# Patient Record
Sex: Male | Born: 1975 | Race: White | Hispanic: No | Marital: Married | Smoking: Current every day smoker
Health system: Southern US, Community
[De-identification: ages and names within clinical notes are randomized; demographics above are authoritative.]

## PROBLEM LIST (undated history)

## (undated) DIAGNOSIS — F1411 Cocaine abuse, in remission: Secondary | ICD-10-CM

## (undated) DIAGNOSIS — Z8249 Family history of ischemic heart disease and other diseases of the circulatory system: Secondary | ICD-10-CM

## (undated) DIAGNOSIS — K859 Acute pancreatitis without necrosis or infection, unspecified: Secondary | ICD-10-CM

## (undated) DIAGNOSIS — F1210 Cannabis abuse, uncomplicated: Secondary | ICD-10-CM

## (undated) DIAGNOSIS — N200 Calculus of kidney: Secondary | ICD-10-CM

## (undated) DIAGNOSIS — F1010 Alcohol abuse, uncomplicated: Secondary | ICD-10-CM

## (undated) DIAGNOSIS — Z720 Tobacco use: Secondary | ICD-10-CM

## (undated) DIAGNOSIS — J45909 Unspecified asthma, uncomplicated: Secondary | ICD-10-CM

## (undated) HISTORY — PX: APPENDECTOMY: SHX54

---

## 2004-10-19 ENCOUNTER — Emergency Department: Payer: Self-pay | Admitting: Emergency Medicine

## 2005-09-20 ENCOUNTER — Emergency Department: Payer: Self-pay | Admitting: Emergency Medicine

## 2008-05-27 ENCOUNTER — Emergency Department: Payer: Self-pay | Admitting: Emergency Medicine

## 2008-05-27 ENCOUNTER — Other Ambulatory Visit: Payer: Self-pay

## 2008-06-12 ENCOUNTER — Emergency Department: Payer: Self-pay

## 2008-07-11 ENCOUNTER — Emergency Department: Payer: Self-pay | Admitting: Emergency Medicine

## 2009-11-12 ENCOUNTER — Emergency Department: Payer: Self-pay | Admitting: Emergency Medicine

## 2009-12-10 ENCOUNTER — Ambulatory Visit: Payer: Self-pay | Admitting: Urology

## 2010-04-27 ENCOUNTER — Emergency Department: Payer: Self-pay | Admitting: Emergency Medicine

## 2011-04-23 ENCOUNTER — Emergency Department: Payer: Self-pay | Admitting: Emergency Medicine

## 2011-05-12 ENCOUNTER — Emergency Department: Payer: Self-pay | Admitting: Emergency Medicine

## 2011-05-29 ENCOUNTER — Emergency Department: Payer: Self-pay | Admitting: Unknown Physician Specialty

## 2011-10-01 ENCOUNTER — Emergency Department: Payer: Self-pay | Admitting: Emergency Medicine

## 2012-01-04 ENCOUNTER — Emergency Department: Payer: Self-pay | Admitting: Unknown Physician Specialty

## 2013-01-10 ENCOUNTER — Emergency Department: Payer: Self-pay | Admitting: Internal Medicine

## 2013-06-01 ENCOUNTER — Emergency Department: Payer: Self-pay | Admitting: Internal Medicine

## 2013-06-04 ENCOUNTER — Emergency Department: Payer: Self-pay | Admitting: Emergency Medicine

## 2013-06-06 LAB — WOUND CULTURE

## 2013-06-20 ENCOUNTER — Other Ambulatory Visit: Payer: Self-pay | Admitting: Surgery

## 2013-06-20 LAB — BASIC METABOLIC PANEL
Chloride: 107 mmol/L (ref 98–107)
Co2: 29 mmol/L (ref 21–32)
Creatinine: 0.94 mg/dL (ref 0.60–1.30)
EGFR (Non-African Amer.): >60
Glucose: 87 mg/dL (ref 65–99)
Osmolality: 276 (ref 275–301)
Sodium: 138 mmol/L (ref 136–145)

## 2013-06-20 LAB — CBC WITH DIFFERENTIAL/PLATELET
Basophil #: 0.1 10*3/uL (ref 0.0–0.1)
Basophil %: 0.9 %
Eosinophil %: 5.4 %
HCT: 42.9 % (ref 40.0–52.0)
HGB: 14.8 g/dL (ref 13.0–18.0)
Lymphocyte #: 3.1 10*3/uL (ref 1.0–3.6)
MCH: 32.2 pg (ref 26.0–34.0)
MCHC: 34.5 g/dL (ref 32.0–36.0)
Neutrophil #: 4.1 10*3/uL (ref 1.4–6.5)
Neutrophil %: 48.4 %
Platelet: 280 10*3/uL (ref 150–440)
RDW: 13.1 % (ref 11.5–14.5)
WBC: 8.4 10*3/uL (ref 3.8–10.6)

## 2013-06-24 ENCOUNTER — Ambulatory Visit: Payer: Self-pay | Admitting: Surgery

## 2013-10-13 ENCOUNTER — Emergency Department: Payer: Self-pay | Admitting: Emergency Medicine

## 2013-10-26 ENCOUNTER — Emergency Department: Payer: Self-pay | Admitting: Emergency Medicine

## 2013-10-26 LAB — TROPONIN I: Troponin-I: <0.02 ng/mL

## 2014-07-20 ENCOUNTER — Emergency Department: Payer: Self-pay | Admitting: Internal Medicine

## 2015-06-08 ENCOUNTER — Emergency Department: Payer: Medicaid Other

## 2015-06-08 ENCOUNTER — Encounter: Payer: Self-pay | Admitting: Emergency Medicine

## 2015-06-08 ENCOUNTER — Emergency Department
Admission: EM | Admit: 2015-06-08 | Discharge: 2015-06-08 | Disposition: A | Discharge status: Home / Self Care | Payer: Medicaid Other | Attending: Emergency Medicine | Admitting: Emergency Medicine

## 2015-06-08 DIAGNOSIS — S3992XA Unspecified injury of lower back, initial encounter: Secondary | ICD-10-CM | POA: Insufficient documentation

## 2015-06-08 DIAGNOSIS — M5020 Other cervical disc displacement, unspecified cervical region: Secondary | ICD-10-CM | POA: Diagnosis not present

## 2015-06-08 DIAGNOSIS — Z720 Tobacco use: Secondary | ICD-10-CM | POA: Diagnosis not present

## 2015-06-08 DIAGNOSIS — M545 Low back pain, unspecified: Secondary | ICD-10-CM

## 2015-06-08 DIAGNOSIS — W109XXA Fall (on) (from) unspecified stairs and steps, initial encounter: Secondary | ICD-10-CM | POA: Insufficient documentation

## 2015-06-08 DIAGNOSIS — Y9389 Activity, other specified: Secondary | ICD-10-CM | POA: Insufficient documentation

## 2015-06-08 DIAGNOSIS — Y998 Other external cause status: Secondary | ICD-10-CM | POA: Diagnosis not present

## 2015-06-08 DIAGNOSIS — Y9289 Other specified places as the place of occurrence of the external cause: Secondary | ICD-10-CM | POA: Insufficient documentation

## 2015-06-08 DIAGNOSIS — M7918 Myalgia, other site: Secondary | ICD-10-CM

## 2015-06-08 DIAGNOSIS — M5030 Other cervical disc degeneration, unspecified cervical region: Secondary | ICD-10-CM

## 2015-06-08 HISTORY — DX: Calculus of kidney: N20.0

## 2015-06-08 MED ORDER — TRAMADOL HCL 50 MG PO TABS: 50.0000 mg | ORAL_TABLET | Freq: Four times a day (QID) | ORAL | Status: AC | PRN

## 2015-06-08 NOTE — ED Notes (Signed)
Carrying a tv last night and fell backwards down steps injuring back and neck.  Phil collar applied in triage.

## 2015-06-08 NOTE — Discharge Instructions (Signed)
Your CT shows that you have a bulging disc in your neck. I recommend that you follow up with orthopedics. Call today to schedule an appointment. Return to the ER for worsening pain in your neck or weakness in your arms/hands if you are unable to see the specialist.

## 2015-06-08 NOTE — ED Provider Notes (Signed)
[facility] Emergency Department Provider Note  ____________________________________________  Time seen: Approximately 9:23 AM  I have reviewed the triage vital signs and the nursing notes.   HISTORY  Chief Complaint Fall   HPI ALANZO LAMB III is a 39 y.o. male who presents to the emergency department for evaluation of neck and back pain. He states he was carrying a television down some stairs and fell backward and landed on his back and neck. The incident occurred last night.He denies loss of consciousness.   Past Medical History  Diagnosis Date  . Kidney stones     There are no active problems to display for this patient.   Past Surgical History  Procedure Laterality Date  . Appendectomy      Current Outpatient Rx  Name  Route  Sig  Dispense  Refill  . traMADol (ULTRAM) 50 MG tablet   Oral   Take 1 tablet (50 mg total) by mouth every 6 (six) hours as needed.   9 tablet   0     Allergies Toradol and Tetracyclines & related  No family history on file.  Social History Social History  Substance Use Topics  . Smoking status: Current Every Day Smoker  . Smokeless tobacco: None  . Alcohol Use: No    Review of Systems Constitutional: No recent illness. Eyes: No visual changes. ENT: No sore throat. Cardiovascular: Denies chest pain or palpitations. Respiratory: Denies shortness of breath. Gastrointestinal: No abdominal pain.  Genitourinary: Negative for dysuria. Musculoskeletal: Pain in neck and lower back Skin: Negative for rash. Neurological: Negative for headaches, focal weakness or numbness. 10-point ROS otherwise negative.  ____________________________________________   PHYSICAL EXAM:  VITAL SIGNS: ED Triage Vitals  Enc Vitals Group     BP 06/08/15 0834 113/75 mmHg     Pulse Rate 06/08/15 0834 79     Resp 06/08/15 0834 16     Temp 06/08/15 0834 97.7 F (36.5 C)     Temp Source 06/08/15 0834 Oral     SpO2 06/08/15  0834 98 %     Weight 06/08/15 0834 140 lb (63.504 kg)     Height 06/08/15 0834 5\' 9"  (1.753 m)     Head Cir --      Peak Flow --      Pain Score 06/08/15 0834 8     Pain Loc --      Pain Edu? --      Excl. in GC? --     Constitutional: Alert and oriented. Well appearing and in no acute distress. Eyes: Conjunctivae are normal. EOMI. Head: Atraumatic. Nose: No congestion/rhinnorhea. Neck: No stridor.  Respiratory: Normal respiratory effort.   Musculoskeletal: Midline tenderness and paraspinal tenderness to the lower cervical spine. Midline tenderness to the lumbar area. Neurologic:  Normal speech and language. No gross focal neurologic deficits are appreciated. Speech is normal. No gait instability. Grip strength is equal. Skin:  Skin is warm, dry and intact. Atraumatic. Psychiatric: Mood and affect are normal. Speech and behavior are normal.  ____________________________________________   LABS (all labs ordered are listed, but only abnormal results are displayed)  Labs Reviewed - No data to display ____________________________________________  RADIOLOGY  CT of the cervical spine shows a disc bulge at the C 5 to C6 level with possible central focal protrusion. ____________________________________________   PROCEDURES  Procedure(s) performed: None   ____________________________________________   INITIAL IMPRESSION / ASSESSMENT AND PLAN / ED COURSE  Pertinent labs & imaging results that were available  during my care of the patient were reviewed by me and considered in my medical decision making (see chart for details).  CT results reviewed with patient. Patient was strongly encouraged to follow up with orthopedics. He was advised to return to the emergency department for any weakness or other symptoms of concern if he is unable to see a specialist. ____________________________________________   FINAL CLINICAL IMPRESSION(S) / ED DIAGNOSES  Final diagnoses:  Lumbar  back pain  Bulge of cervical disc without myelopathy  Musculoskeletal pain      Chinita Pester, FNP 06/08/15 1252  Jennye Moccasin, MD 06/08/15 1431

## 2015-09-27 ENCOUNTER — Emergency Department
Admission: EM | Admit: 2015-09-27 | Discharge: 2015-09-28 | Discharge status: Against Medical Advice | Payer: Medicaid Other | Source: Home / Self Care | Attending: Emergency Medicine | Admitting: Emergency Medicine

## 2015-09-27 ENCOUNTER — Emergency Department: Payer: Medicaid Other

## 2015-09-27 ENCOUNTER — Encounter: Payer: Self-pay | Admitting: Emergency Medicine

## 2015-09-27 DIAGNOSIS — F10920 Alcohol use, unspecified with intoxication, uncomplicated: Secondary | ICD-10-CM

## 2015-09-27 DIAGNOSIS — R0602 Shortness of breath: Secondary | ICD-10-CM

## 2015-09-27 DIAGNOSIS — K831 Obstruction of bile duct: Secondary | ICD-10-CM

## 2015-09-27 DIAGNOSIS — R1012 Left upper quadrant pain: Secondary | ICD-10-CM

## 2015-09-27 DIAGNOSIS — K859 Acute pancreatitis, unspecified: Secondary | ICD-10-CM

## 2015-09-27 HISTORY — DX: Unspecified asthma, uncomplicated: J45.909

## 2015-09-27 LAB — CBC WITH DIFFERENTIAL/PLATELET
BASOS ABS: 0.1 10*3/uL (ref 0–0.1)
BASOS PCT: 1 %
EOS ABS: 0.3 10*3/uL (ref 0–0.7)
EOS PCT: 4 %
HCT: 40.3 % (ref 40.0–52.0)
Hemoglobin: 13.7 g/dL (ref 13.0–18.0)
Lymphocytes Relative: 38 %
Lymphs Abs: 3.3 10*3/uL (ref 1.0–3.6)
MCH: 32.3 pg (ref 26.0–34.0)
MCHC: 33.8 g/dL (ref 32.0–36.0)
MCV: 95.3 fL (ref 80.0–100.0)
Monocytes Absolute: 0.7 10*3/uL (ref 0.2–1.0)
Monocytes Relative: 8 %
Neutro Abs: 4.2 10*3/uL (ref 1.4–6.5)
Neutrophils Relative %: 49 %
PLATELETS: 265 10*3/uL (ref 150–440)
RBC: 4.23 MIL/uL — AB (ref 4.40–5.90)
RDW: 13.3 % (ref 11.5–14.5)
WBC: 8.6 10*3/uL (ref 3.8–10.6)

## 2015-09-27 MED ORDER — ONDANSETRON HCL 4 MG/2ML IJ SOLN
4.0000 mg | Freq: Once | INTRAMUSCULAR | Status: AC
  Administered 2015-09-28: 4 mg via INTRAVENOUS
  Filled 2015-09-27: qty 2

## 2015-09-27 MED ORDER — MORPHINE SULFATE (PF) 4 MG/ML IV SOLN
4.0000 mg | Freq: Once | INTRAVENOUS | Status: AC
  Administered 2015-09-28: 4 mg via INTRAVENOUS
  Filled 2015-09-27: qty 1

## 2015-09-27 NOTE — ED Provider Notes (Signed)
[facility] Emergency Department Provider Note  ____________________________________________  Time seen: Approximately 11:54 PM  I have reviewed the triage vital signs and the nursing notes.   HISTORY  Chief Complaint Shortness of Breath and Chest Pain    HPI Randy Abbott is a 39 y.o. male who presents to the ED from home via EMS with a chief complaint of left chest pain, shortness of breath and productive green cough. Patient is a heavy smoker and drinker who states he was drinking his third large beer tonight and began to experience left-sided chest pain with shortness of breath. Spouse states he may have fallen twice and struck his chest on the tub. Patient does not recall falling. He also notes recent cough productive of green sputum. Denies associated fever, chills, abdominal pain, nausea, vomiting, diarrhea. Nothing makes his symptoms better. Coughing and movement makes his pain worse.  Past Medical History  Diagnosis Date  . Kidney stones   . Asthma     There are no active problems to display for this patient.   Past Surgical History  Procedure Laterality Date  . Appendectomy      Current Outpatient Rx  Name  Route  Sig  Dispense  Refill  . traMADol (ULTRAM) 50 MG tablet   Oral   Take 1 tablet (50 mg total) by mouth every 6 (six) hours as needed.   9 tablet   0     Allergies Penicillins; Toradol; and Tetracyclines & related  History reviewed. No pertinent family history.  Social History Social History  Substance Use Topics  . Smoking status: Current Every Day Smoker -- 3.00 packs/day    Types: Cigarettes  . Smokeless tobacco: None  . Alcohol Use: Yes     Comment: 2-3 beers nightly    Review of Systems Constitutional: No fever/chills Eyes: No visual changes. ENT: No sore throat. Cardiovascular: Positive for chest pain. Respiratory: Positive for shortness of breath. Gastrointestinal: No abdominal pain.  No nausea, no  vomiting.  No diarrhea.  No constipation. Genitourinary: Negative for dysuria. Musculoskeletal: Negative for back pain. Skin: Negative for rash. Neurological: Negative for headaches, focal weakness or numbness.  10-point ROS otherwise negative.  ____________________________________________   PHYSICAL EXAM:  VITAL SIGNS: ED Triage Vitals  Enc Vitals Group     BP 09/27/15 2317 116/77 mmHg     Pulse Rate 09/27/15 2317 81     Resp 09/27/15 2317 12     Temp 09/27/15 2317 97.5 F (36.4 C)     Temp Source 09/27/15 2317 Oral     SpO2 09/27/15 2317 95 %     Weight 09/27/15 2317 130 lb (58.968 kg)     Height 09/27/15 2317  (1.753 m)     Head Cir --      Peak Flow --      Pain Score 09/27/15 2319 8     Pain Loc --      Pain Edu? --      Excl. in GC? --     Constitutional: Alert and oriented. Thin, intoxicated appearing and in moderate acute distress. Eyes: Conjunctivae are normal. PERRL. EOMI. Head: Atraumatic. Nose: No congestion/rhinnorhea. Mouth/Throat: Mucous membranes are moist.  Oropharynx non-erythematous. Neck: No stridor.  No cervical spine tenderness to palpation. Cardiovascular: Normal rate, regular rhythm. Grossly normal heart sounds.  Good peripheral circulation. Respiratory: Increased respiratory effort.  No retractions. Splinting. Lungs diminished bilaterally. Left anterior and lateral ribs tender to palpation. Gastrointestinal: Soft and tender  to palpation left upper quadrant. No distention. No abdominal bruits. No CVA tenderness. Musculoskeletal: No lower extremity tenderness nor edema.  No joint effusions. Neurologic:  Normal speech and language. No gross focal neurologic deficits are appreciated.  Skin:  Skin is warm, dry and intact. No rash noted. Psychiatric: Mood and affect are normal. Speech and behavior are normal.  ____________________________________________   LABS (all labs ordered are listed, but only abnormal results are displayed)  Labs  Reviewed  CBC WITH DIFFERENTIAL/PLATELET - Abnormal; Notable for the following:    RBC 4.23 (*)    All other components within normal limits  COMPREHENSIVE METABOLIC PANEL - Abnormal; Notable for the following:    Calcium 8.7 (*)    All other components within normal limits  LIPASE, BLOOD - Abnormal; Notable for the following:    Lipase 392 (*)    All other components within normal limits  ETHANOL - Abnormal; Notable for the following:    Alcohol, Ethyl (B) 148 (*)    All other components within normal limits  TROPONIN I   ____________________________________________  EKG  ED ECG REPORT I, Graceson Nichelson J, the attending physician, personally viewed and interpreted this ECG.   Date: 09/28/2015  EKG Time: 2315  Rate: 86  Rhythm: normal EKG, normal sinus rhythm  Axis: Normal  Intervals:none  ST&T Change: Nonspecific  ____________________________________________  RADIOLOGY  Chest 2 view (view by me, interpreted per Dr. Cherly Hensenhang): No acute cardiopulmonary process seen.  CT chest, abdomen/pelvis interpreted per Dr. Cherly Hensenhang: 1. No evidence of traumatic injury to the chest, abdomen or pelvis. 2. Diffuse prominence of the intrahepatic biliary ducts, with distention of the common bile duct to 9 mm, and distention of the pancreatic duct to 4 mm. This is concerning for distal obstruction. MRCP or ERCP would be helpful for further evaluation. 3. Minimal right basilar atelectasis noted. Lungs otherwise clear. Emphysematous change noted bilaterally, most prominent in the periphery of the upper lung lobes, with mild biapical scarring. Prominent right-sided bulla noted anterior to the mediastinum. 4. 6 mm nonobstructing stone at the interpole region of the left kidney. 5. Minimal calcification along the distal abdominal aorta and its branches, mildly advanced for age. ____________________________________________   PROCEDURES  Procedure(s) performed: None  Critical Care performed:  No  ____________________________________________   INITIAL IMPRESSION / ASSESSMENT AND PLAN / ED COURSE  Pertinent labs & imaging results that were available during my care of the patient were reviewed by me and considered in my medical decision making (see chart for details).  39 year old male who presents with intoxication, questionable fall with left chest pain, shortness of breath and productive cough. Patient is splinting and very uncomfortable on my exam. Initial wet read of chest x-ray unremarkable for details with or acts. Will obtain CT imaging study of chest abdomen and pelvis.  ----------------------------------------- 2:12 AM on 09/28/2015 -----------------------------------------  Updated patient and spouse of CT results and recommended hospital admission. Patient is upset and tearful, trying to remove his IV. States he cannot stay due to he is the sole provider for his family including children and parents. I stressed the importance to the patient and his spouse of hospital admission and caution him that his pancreatitis can become worse, especially coupled with daily alcohol use. I discussed the risks and benefits of leaving AGAINST MEDICAL ADVICE with both patient and spouse, including the risk of worsening pain, sepsis and death. Patient is alert, oriented, ambulating with steady gait and has the mental capacity to participate in this AMA  discussion. Both patient and spouse verbalize the above discussion and patient wishes to proceed with signing AMA. I assured him that he is welcome back at any time. Strict return precautions given. Both verbalize understanding and agree with plan of care. ____________________________________________   FINAL CLINICAL IMPRESSION(S) / ED DIAGNOSES  Final diagnoses:  Shortness of breath  Left upper quadrant pain  Acute pancreatitis, unspecified pancreatitis type  Biliary obstruction  Alcohol intoxication, uncomplicated (HCC)      Irean Hong, MD 09/28/15 [phone]

## 2015-09-27 NOTE — ED Notes (Addendum)
39 yom PMHx smoker, asthma, ulcers and daily ETOH drinker presents via EMS from home with c/c chest pain, shortness of breath and productive green cough. EMS gave albuterol 5mg  neb with some improvement of shortness of breath.  Pt states his pain came on while drinking his 3rd large (40 ounce) beer tonight.

## 2015-09-28 ENCOUNTER — Encounter: Payer: Self-pay | Admitting: Radiology

## 2015-09-28 ENCOUNTER — Emergency Department: Payer: Medicaid Other

## 2015-09-28 LAB — COMPREHENSIVE METABOLIC PANEL
ALBUMIN: 4.2 g/dL (ref 3.5–5.0)
ALK PHOS: 64 U/L (ref 38–126)
ALT: 19 U/L (ref 17–63)
AST: 28 U/L (ref 15–41)
Anion gap: 7 (ref 5–15)
BUN: 11 mg/dL (ref 6–20)
CO2: 25 mmol/L (ref 22–32)
CREATININE: 0.81 mg/dL (ref 0.61–1.24)
Calcium: 8.7 mg/dL — ABNORMAL LOW (ref 8.9–10.3)
Chloride: 109 mmol/L (ref 101–111)
GFR calc non Af Amer: >60 mL/min (ref >60)
GLUCOSE: 95 mg/dL (ref 65–99)
Potassium: 3.9 mmol/L (ref 3.5–5.1)
SODIUM: 141 mmol/L (ref 135–145)
Total Bilirubin: 0.6 mg/dL (ref 0.3–1.2)
Total Protein: 7 g/dL (ref 6.5–8.1)

## 2015-09-28 LAB — TROPONIN I

## 2015-09-28 LAB — LIPASE, BLOOD: LIPASE: 392 U/L — AB (ref 11–51)

## 2015-09-28 LAB — ETHANOL: Alcohol, Ethyl (B): 148 mg/dL — ABNORMAL HIGH (ref <5)

## 2015-09-28 MED ORDER — ONDANSETRON HCL 4 MG PO TABS: 4.0000 mg | ORAL_TABLET | Freq: Three times a day (TID) | ORAL | Status: DC | PRN

## 2015-09-28 MED ORDER — IOHEXOL 300 MG/ML  SOLN
100.0000 mL | Freq: Once | INTRAMUSCULAR | Status: AC | PRN
  Administered 2015-09-28: 100 mL via INTRAVENOUS

## 2015-09-28 MED ORDER — OXYCODONE-ACETAMINOPHEN 5-325 MG PO TABS
ORAL_TABLET | ORAL | Status: AC
  Administered 2015-09-28: 1 via ORAL
  Filled 2015-09-28: qty 1

## 2015-09-28 MED ORDER — OXYCODONE-ACETAMINOPHEN 5-325 MG PO TABS: 1.0000 | ORAL_TABLET | ORAL | Status: AC | PRN

## 2015-09-28 MED ORDER — SODIUM CHLORIDE 0.9 % IV BOLUS (SEPSIS)
1000.0000 mL | Freq: Once | INTRAVENOUS | Status: AC
  Administered 2015-09-28: 1000 mL via INTRAVENOUS

## 2015-09-28 MED ORDER — OXYCODONE-ACETAMINOPHEN 5-325 MG PO TABS
1.0000 | ORAL_TABLET | Freq: Once | ORAL | Status: AC
  Administered 2015-09-28: 1 via ORAL

## 2015-09-28 NOTE — Discharge Instructions (Signed)
You are leaving AGAINST MEDICAL ADVICE. Your pancreas is inflamed and I am concerned you have an obstruction in your liver system. Please see your doctor immediately. You may take pain and nausea medicine (Percocet/Zofran #15) as needed. Eat a bland diet and avoid alcohol. You are welcome to return at any time for hospital admission. Return to the ER for worsening symptoms, persistent vomiting, difficulty breathing or other concerns.  Abdominal Pain, Adult Many things can cause abdominal pain. Usually, abdominal pain is not caused by a disease and will improve without treatment. It can often be observed and treated at home. Your health care provider will do a physical exam and possibly order blood tests and X-rays to help determine the seriousness of your pain. However, in many cases, more time must pass before a clear cause of the pain can be found. Before that point, your health care provider may not know if you need more testing or further treatment. HOME CARE INSTRUCTIONS Monitor your abdominal pain for any changes. The following actions may help to alleviate any discomfort you are experiencing:  Only take over-the-counter or prescription medicines as directed by your health care provider.  Do not take laxatives unless directed to do so by your health care provider.  Try a clear liquid diet (broth, tea, or water) as directed by your health care provider. Slowly move to a bland diet as tolerated. SEEK MEDICAL CARE IF:  You have unexplained abdominal pain.  You have abdominal pain associated with nausea or diarrhea.  You have pain when you urinate or have a bowel movement.  You experience abdominal pain that wakes you in the night.  You have abdominal pain that is worsened or improved by eating food.  You have abdominal pain that is worsened with eating fatty foods.  You have a fever. SEEK IMMEDIATE MEDICAL CARE IF:  Your pain does not go away within 2 hours.  You keep throwing up  (vomiting).  Your pain is felt only in portions of the abdomen, such as the right side or the left lower portion of the abdomen.  You pass bloody or black tarry stools. MAKE SURE YOU:  Understand these instructions.  Will watch your condition.  Will get help right away if you are not doing well or get worse.   This information is not intended to replace advice given to you by your health care provider. Make sure you discuss any questions you have with your health care provider.   Document Released: 07/13/2005 Document Revised: 06/24/2015 Document Reviewed: 06/12/2013 Elsevier Interactive Patient Education 2016 [employer]  Acute Pancreatitis Acute pancreatitis is a disease in which the pancreas becomes suddenly inflamed. The pancreas is a large gland located behind your stomach. The pancreas produces enzymes that help digest food. The pancreas also releases the hormones glucagon and insulin that help regulate blood sugar. Damage to the pancreas occurs when the digestive enzymes from the pancreas are activated and begin attacking the pancreas before being released into the intestine. Most acute attacks last a couple of days and can cause serious complications. Some people become dehydrated and develop low blood pressure. In severe cases, bleeding into the pancreas can lead to shock and can be life-threatening. The lungs, heart, and kidneys may fail. CAUSES  Pancreatitis can happen to anyone. In some cases, the cause is unknown. Most cases are caused by:  Alcohol abuse.  Gallstones. Other less common causes are:  Certain medicines.  Exposure to certain chemicals.  Infection.  Damage caused  by an accident (trauma).  Abdominal surgery. SYMPTOMS   Pain in the upper abdomen that may radiate to the back.  Tenderness and swelling of the abdomen.  Nausea and vomiting. DIAGNOSIS  Your caregiver will perform a physical exam. Blood and stool tests may be done to confirm the  diagnosis. Imaging tests may also be done, such as X-rays, CT scans, or an ultrasound of the abdomen. TREATMENT  Treatment usually requires a stay in the hospital. Treatment may include:  Pain medicine.  Fluid replacement through an intravenous line (IV).  Placing a tube in the stomach to remove stomach contents and control vomiting.  Not eating for 3 or 4 days. This gives your pancreas a rest, because enzymes are not being produced that can cause further damage.  Antibiotic medicines if your condition is caused by an infection.  Surgery of the pancreas or gallbladder. HOME CARE INSTRUCTIONS   Follow the diet advised by your caregiver. This may involve avoiding alcohol and decreasing the amount of fat in your diet.  Eat smaller, more frequent meals. This reduces the amount of digestive juices the pancreas produces.  Drink enough fluids to keep your urine clear or pale yellow.  Only take over-the-counter or prescription medicines as directed by your caregiver.  Avoid drinking alcohol if it caused your condition.  Do not smoke.  Get plenty of rest.  Check your blood sugar at home as directed by your caregiver.  Keep all follow-up appointments as directed by your caregiver. SEEK MEDICAL CARE IF:   You do not recover as quickly as expected.  You develop new or worsening symptoms.  You have persistent pain, weakness, or nausea.  You recover and then have another episode of pain. SEEK IMMEDIATE MEDICAL CARE IF:   You are unable to eat or keep fluids down.  Your pain becomes severe.  You have a fever or persistent symptoms for more than 2 to 3 days.  You have a fever and your symptoms suddenly get worse.  Your skin or the white part of your eyes turn yellow (jaundice).  You develop vomiting.  You feel dizzy, or you faint.  Your blood sugar is high (over 300 mg/dL). MAKE SURE YOU:   Understand these instructions.  Will watch your condition.  Will get help  right away if you are not doing well or get worse.   This information is not intended to replace advice given to you by your health care provider. Make sure you discuss any questions you have with your health care provider.   Document Released: 10/03/2005 Document Revised: 04/03/2012 Document Reviewed: 01/12/2012 Elsevier Interactive Patient Education 2016 [employer].  Alcohol Intoxication Alcohol intoxication occurs when the amount of alcohol that a person has consumed impairs his or her ability to mentally and physically function. Alcohol directly impairs the normal chemical activity of the brain. Drinking large amounts of alcohol can lead to changes in mental function and behavior, and it can cause many physical effects that can be harmful.  Alcohol intoxication can range in severity from mild to very severe. Various factors can affect the level of intoxication that occurs, such as the person's age, gender, weight, frequency of alcohol consumption, and the presence of other medical conditions (such as diabetes, seizures, or heart conditions). Dangerous levels of alcohol intoxication may occur when people drink large amounts of alcohol in a short period (binge drinking). Alcohol can also be especially dangerous when combined with certain prescription medicines or "recreational" drugs. SIGNS AND SYMPTOMS  Some common signs and symptoms of mild alcohol intoxication include:  Loss of coordination.  Changes in mood and behavior.  Impaired judgment.  Slurred speech. As alcohol intoxication progresses to more severe levels, other signs and symptoms will appear. These may include:  Vomiting.  Confusion and impaired memory.  Slowed breathing.  Seizures.  Loss of consciousness. DIAGNOSIS  Your health care provider will take a medical history and perform a physical exam. You will be asked about the amount and type of alcohol you have consumed. Blood tests will be done to measure the  concentration of alcohol in your blood. In many places, your blood alcohol level must be lower than 80 mg/dL (1.61%0.08%) to legally drive. However, many dangerous effects of alcohol can occur at much lower levels.  TREATMENT  People with alcohol intoxication often do not require treatment. Most of the effects of alcohol intoxication are temporary, and they go away as the alcohol naturally leaves the body. Your health care provider will monitor your condition until you are stable enough to go home. Fluids are sometimes given through an IV access tube to help prevent dehydration.  HOME CARE INSTRUCTIONS  Do not drive after drinking alcohol.  Stay hydrated. Drink enough water and fluids to keep your urine clear or pale yellow. Avoid caffeine.   Only take over-the-counter or prescription medicines as directed by your health care provider.  SEEK MEDICAL CARE IF:   You have persistent vomiting.   You do not feel better after a few days.  You have frequent alcohol intoxication. Your health care provider can help determine if you should see a substance use treatment counselor. SEEK IMMEDIATE MEDICAL CARE IF:   You become shaky or tremble when you try to stop drinking.   You shake uncontrollably (seizure).   You throw up (vomit) blood. This may be bright red or may look like black coffee grounds.   You have blood in your stool. This may be bright red or may appear as a black, tarry, bad smelling stool.   You become lightheaded or faint.  MAKE SURE YOU:   Understand these instructions.  Will watch your condition.  Will get help right away if you are not doing well or get worse.   This information is not intended to replace advice given to you by your health care provider. Make sure you discuss any questions you have with your health care provider.   Document Released: 07/13/2005 Document Revised: 06/05/2013 Document Reviewed: 03/08/2013 Elsevier Interactive Patient Education [id number]  [employer]

## 2015-09-29 ENCOUNTER — Emergency Department: Payer: Medicaid Other

## 2015-09-29 ENCOUNTER — Inpatient Hospital Stay: Payer: Medicaid Other

## 2015-09-29 ENCOUNTER — Inpatient Hospital Stay
Admission: EM | Admit: 2015-09-29 | Discharge: 2015-09-30 | DRG: 440 | Disposition: A | Discharge status: Home / Self Care | Payer: Medicaid Other | Attending: Internal Medicine | Admitting: Internal Medicine

## 2015-09-29 ENCOUNTER — Encounter: Payer: Self-pay | Admitting: Medical Oncology

## 2015-09-29 DIAGNOSIS — F17210 Nicotine dependence, cigarettes, uncomplicated: Secondary | ICD-10-CM | POA: Diagnosis present

## 2015-09-29 DIAGNOSIS — Z801 Family history of malignant neoplasm of trachea, bronchus and lung: Secondary | ICD-10-CM

## 2015-09-29 DIAGNOSIS — Z9049 Acquired absence of other specified parts of digestive tract: Secondary | ICD-10-CM

## 2015-09-29 DIAGNOSIS — F1290 Cannabis use, unspecified, uncomplicated: Secondary | ICD-10-CM | POA: Diagnosis present

## 2015-09-29 DIAGNOSIS — Z87442 Personal history of urinary calculi: Secondary | ICD-10-CM

## 2015-09-29 DIAGNOSIS — Z800 Family history of malignant neoplasm of digestive organs: Secondary | ICD-10-CM

## 2015-09-29 DIAGNOSIS — Z806 Family history of leukemia: Secondary | ICD-10-CM | POA: Diagnosis not present

## 2015-09-29 DIAGNOSIS — K838 Other specified diseases of biliary tract: Secondary | ICD-10-CM

## 2015-09-29 DIAGNOSIS — K859 Acute pancreatitis without necrosis or infection, unspecified: Principal | ICD-10-CM | POA: Diagnosis present

## 2015-09-29 DIAGNOSIS — Z888 Allergy status to other drugs, medicaments and biological substances status: Secondary | ICD-10-CM | POA: Diagnosis not present

## 2015-09-29 DIAGNOSIS — F1010 Alcohol abuse, uncomplicated: Secondary | ICD-10-CM | POA: Diagnosis present

## 2015-09-29 DIAGNOSIS — Z880 Allergy status to penicillin: Secondary | ICD-10-CM

## 2015-09-29 DIAGNOSIS — Z716 Tobacco abuse counseling: Secondary | ICD-10-CM | POA: Diagnosis not present

## 2015-09-29 DIAGNOSIS — Z79899 Other long term (current) drug therapy: Secondary | ICD-10-CM

## 2015-09-29 DIAGNOSIS — R109 Unspecified abdominal pain: Secondary | ICD-10-CM

## 2015-09-29 DIAGNOSIS — K5900 Constipation, unspecified: Secondary | ICD-10-CM | POA: Diagnosis present

## 2015-09-29 DIAGNOSIS — J45909 Unspecified asthma, uncomplicated: Secondary | ICD-10-CM | POA: Diagnosis present

## 2015-09-29 DIAGNOSIS — K8689 Other specified diseases of pancreas: Secondary | ICD-10-CM | POA: Diagnosis present

## 2015-09-29 DIAGNOSIS — R1084 Generalized abdominal pain: Secondary | ICD-10-CM

## 2015-09-29 DIAGNOSIS — K831 Obstruction of bile duct: Secondary | ICD-10-CM

## 2015-09-29 DIAGNOSIS — E860 Dehydration: Secondary | ICD-10-CM | POA: Diagnosis present

## 2015-09-29 HISTORY — DX: Tobacco use: Z72.0

## 2015-09-29 HISTORY — DX: Cocaine abuse, in remission: F14.11

## 2015-09-29 HISTORY — DX: Alcohol abuse, uncomplicated: F10.10

## 2015-09-29 HISTORY — DX: Cannabis abuse, uncomplicated: F12.10

## 2015-09-29 LAB — URINALYSIS COMPLETE WITH MICROSCOPIC (ARMC ONLY)
BACTERIA UA: NONE SEEN
Bilirubin Urine: NEGATIVE
GLUCOSE, UA: NEGATIVE mg/dL
HGB URINE DIPSTICK: NEGATIVE
Ketones, ur: NEGATIVE mg/dL
LEUKOCYTES UA: NEGATIVE
NITRITE: NEGATIVE
PH: 6 (ref 5.0–8.0)
Protein, ur: NEGATIVE mg/dL
RBC / HPF: NONE SEEN RBC/hpf (ref 0–5)
SPECIFIC GRAVITY, URINE: 1.006 (ref 1.005–1.030)
SQUAMOUS EPITHELIAL / LPF: NONE SEEN

## 2015-09-29 LAB — CBC
HEMATOCRIT: 43.3 % (ref 40.0–52.0)
HEMOGLOBIN: 14.6 g/dL (ref 13.0–18.0)
MCH: 32.1 pg (ref 26.0–34.0)
MCHC: 33.6 g/dL (ref 32.0–36.0)
MCV: 95.6 fL (ref 80.0–100.0)
Platelets: 275 10*3/uL (ref 150–440)
RBC: 4.53 MIL/uL (ref 4.40–5.90)
RDW: 13 % (ref 11.5–14.5)
WBC: 7.9 10*3/uL (ref 3.8–10.6)

## 2015-09-29 LAB — COMPREHENSIVE METABOLIC PANEL
ALT: 19 U/L (ref 17–63)
ANION GAP: 7 (ref 5–15)
AST: 26 U/L (ref 15–41)
Albumin: 4.3 g/dL (ref 3.5–5.0)
Alkaline Phosphatase: 62 U/L (ref 38–126)
BILIRUBIN TOTAL: 1.1 mg/dL (ref 0.3–1.2)
BUN: 13 mg/dL (ref 6–20)
CHLORIDE: 102 mmol/L (ref 101–111)
CO2: 30 mmol/L (ref 22–32)
Calcium: 9.3 mg/dL (ref 8.9–10.3)
Creatinine, Ser: 0.77 mg/dL (ref 0.61–1.24)
GFR calc Af Amer: >60 mL/min (ref >60)
Glucose, Bld: 98 mg/dL (ref 65–99)
POTASSIUM: 5 mmol/L (ref 3.5–5.1)
Sodium: 139 mmol/L (ref 135–145)
TOTAL PROTEIN: 6.8 g/dL (ref 6.5–8.1)

## 2015-09-29 LAB — LIPASE, BLOOD: LIPASE: 28 U/L (ref 11–51)

## 2015-09-29 MED ORDER — HYDROMORPHONE HCL 1 MG/ML IJ SOLN
INTRAMUSCULAR | Status: AC
  Administered 2015-09-29: 1 mg via INTRAVENOUS
  Filled 2015-09-29: qty 1

## 2015-09-29 MED ORDER — SODIUM CHLORIDE 0.9 % IV SOLN
INTRAVENOUS | Status: DC
  Administered 2015-09-29 – 2015-09-30 (×4): via INTRAVENOUS

## 2015-09-29 MED ORDER — ACETAMINOPHEN 325 MG PO TABS: 650.0000 mg | ORAL_TABLET | Freq: Four times a day (QID) | ORAL | Status: DC | PRN

## 2015-09-29 MED ORDER — FOLIC ACID 1 MG PO TABS
1.0000 mg | ORAL_TABLET | Freq: Every day | ORAL | Status: DC
  Administered 2015-09-29 – 2015-09-30 (×2): 1 mg via ORAL
  Filled 2015-09-29 (×2): qty 1

## 2015-09-29 MED ORDER — ENOXAPARIN SODIUM 40 MG/0.4ML ~~LOC~~ SOLN
40.0000 mg | SUBCUTANEOUS | Status: DC
  Administered 2015-09-29: 40 mg via SUBCUTANEOUS
  Filled 2015-09-29: qty 0.4

## 2015-09-29 MED ORDER — NICOTINE 14 MG/24HR TD PT24
14.0000 mg | MEDICATED_PATCH | Freq: Every day | TRANSDERMAL | Status: DC
Start: 2015-09-29 — End: 2015-09-30
  Administered 2015-09-29 – 2015-09-30 (×2): 14 mg via TRANSDERMAL
  Filled 2015-09-29 (×2): qty 1

## 2015-09-29 MED ORDER — HYDROCODONE-ACETAMINOPHEN 5-325 MG PO TABS
1.0000 | ORAL_TABLET | ORAL | Status: DC | PRN
  Administered 2015-09-29 – 2015-09-30 (×2): 2 via ORAL
  Filled 2015-09-29 (×2): qty 2

## 2015-09-29 MED ORDER — DOCUSATE SODIUM 100 MG PO CAPS
100.0000 mg | ORAL_CAPSULE | Freq: Two times a day (BID) | ORAL | Status: DC
  Administered 2015-09-29 – 2015-09-30 (×3): 100 mg via ORAL
  Filled 2015-09-29 (×3): qty 1

## 2015-09-29 MED ORDER — BISACODYL 10 MG RE SUPP: 10.0000 mg | Freq: Every day | RECTAL | Status: DC | PRN

## 2015-09-29 MED ORDER — THIAMINE HCL 100 MG/ML IJ SOLN
100.0000 mg | Freq: Every day | INTRAMUSCULAR | Status: DC
  Filled 2015-09-29 (×2): qty 2

## 2015-09-29 MED ORDER — ACETAMINOPHEN 650 MG RE SUPP: 650.0000 mg | Freq: Four times a day (QID) | RECTAL | Status: DC | PRN

## 2015-09-29 MED ORDER — LORAZEPAM 2 MG/ML IJ SOLN
0.0000 mg | Freq: Four times a day (QID) | INTRAMUSCULAR | Status: DC
  Administered 2015-09-29: 4 mg via INTRAVENOUS
  Administered 2015-09-29: 1 mg via INTRAVENOUS

## 2015-09-29 MED ORDER — ONDANSETRON HCL 4 MG PO TABS: 4.0000 mg | ORAL_TABLET | Freq: Four times a day (QID) | ORAL | Status: DC | PRN

## 2015-09-29 MED ORDER — ALBUTEROL SULFATE (2.5 MG/3ML) 0.083% IN NEBU: 2.5000 mg | INHALATION_SOLUTION | RESPIRATORY_TRACT | Status: DC | PRN

## 2015-09-29 MED ORDER — VITAMIN B-1 100 MG PO TABS
100.0000 mg | ORAL_TABLET | Freq: Every day | ORAL | Status: DC
  Administered 2015-09-29 – 2015-09-30 (×2): 100 mg via ORAL
  Filled 2015-09-29 (×2): qty 1

## 2015-09-29 MED ORDER — POLYETHYLENE GLYCOL 3350 17 G PO PACK: 17.0000 g | PACK | Freq: Every day | ORAL | Status: DC | PRN

## 2015-09-29 MED ORDER — ONDANSETRON HCL 4 MG/2ML IJ SOLN: 4.0000 mg | Freq: Four times a day (QID) | INTRAMUSCULAR | Status: DC | PRN

## 2015-09-29 MED ORDER — HYDROMORPHONE HCL 1 MG/ML IJ SOLN
1.0000 mg | Freq: Once | INTRAMUSCULAR | Status: AC
  Administered 2015-09-29: 1 mg via INTRAVENOUS
  Filled 2015-09-29: qty 1

## 2015-09-29 MED ORDER — LORAZEPAM 2 MG/ML IJ SOLN
0.0000 mg | Freq: Two times a day (BID) | INTRAMUSCULAR | Status: DC
  Filled 2015-09-29: qty 2

## 2015-09-29 MED ORDER — MORPHINE SULFATE (PF) 2 MG/ML IV SOLN
2.0000 mg | INTRAVENOUS | Status: DC | PRN
  Administered 2015-09-29 – 2015-09-30 (×5): 2 mg via INTRAVENOUS
  Filled 2015-09-29 (×6): qty 1

## 2015-09-29 MED ORDER — SODIUM CHLORIDE 0.9 % IV BOLUS (SEPSIS)
1500.0000 mL | Freq: Once | INTRAVENOUS | Status: AC
Start: 2015-09-29 — End: 2015-09-29
  Administered 2015-09-29: 1500 mL via INTRAVENOUS

## 2015-09-29 MED ORDER — ONDANSETRON HCL 4 MG/2ML IJ SOLN
INTRAMUSCULAR | Status: AC
  Administered 2015-09-29: 4 mg via INTRAVENOUS
  Filled 2015-09-29: qty 2

## 2015-09-29 MED ORDER — LORAZEPAM 1 MG PO TABS: 1.0000 mg | ORAL_TABLET | Freq: Four times a day (QID) | ORAL | Status: DC | PRN

## 2015-09-29 MED ORDER — LORAZEPAM 2 MG/ML IJ SOLN
1.0000 mg | Freq: Four times a day (QID) | INTRAMUSCULAR | Status: DC | PRN
  Administered 2015-09-30: 1 mg via INTRAVENOUS
  Filled 2015-09-29 (×2): qty 1

## 2015-09-29 MED ORDER — SODIUM CHLORIDE 0.9 % IV BOLUS (SEPSIS)
1000.0000 mL | Freq: Once | INTRAVENOUS | Status: AC
  Administered 2015-09-29: 1000 mL via INTRAVENOUS

## 2015-09-29 MED ORDER — ADULT MULTIVITAMIN W/MINERALS CH
1.0000 | ORAL_TABLET | Freq: Every day | ORAL | Status: DC
  Administered 2015-09-29 – 2015-09-30 (×2): 1 via ORAL
  Filled 2015-09-29 (×2): qty 1

## 2015-09-29 MED ORDER — HYDROMORPHONE HCL 1 MG/ML IJ SOLN
1.0000 mg | Freq: Once | INTRAMUSCULAR | Status: AC
Start: 2015-09-29 — End: 2015-09-29
  Administered 2015-09-29: 1 mg via INTRAVENOUS

## 2015-09-29 MED ORDER — ONDANSETRON HCL 4 MG/2ML IJ SOLN
4.0000 mg | Freq: Once | INTRAMUSCULAR | Status: AC
  Administered 2015-09-29: 4 mg via INTRAVENOUS

## 2015-09-29 NOTE — Consult Note (Signed)
GI Inpatient Consult Note  Reason for Consult: abd pain, elev lipase, dilated cbd and pd   Attending Requesting Consult: Sudini  History of Present Illness: Randy Abbott is a 39 y.o. male with PMHx ETOH presenting for abd pain.  Was just in ED for cp and SOB on 09/27/15.  Had lipase in 300's, Ct showing CBD to 9 mm, PD to 4 mm concerning for obstruction but normal liver enzymes.  Apparently he was recommended admission but left ama.   He now presents to ED for abd pain. Had KUB in ED which was remarkable.  Abd pain ongoing for few days now.  Epigastric and diffuse. Also reports abd distension and bloating.  NOt moving bowels well.  Denies blood in stool, melena, n/v. Says abd pain is currently severe but also saying he is starving   Has ongoing ETOH and marijuana use.  Leaving the floor to smoke.Marland Kitchen   Past Medical History:  Past Medical History  Diagnosis Date  . Kidney stones   . Asthma   . Marijuana abuse   . Tobacco abuse   . Cocaine abuse in remission   . Alcohol abuse     Problem List: Patient Active Problem List   Diagnosis Date Noted  . Acute pancreatitis 09/29/2015    Past Surgical History: Past Surgical History  Procedure Laterality Date  . Appendectomy      Allergies: Allergies  Allergen Reactions  . Penicillins Hives  . Toradol [Ketorolac Tromethamine] Hives  . Tetracyclines & Related Rash    Home Medications: Prescriptions prior to admission  Medication Sig Dispense Refill Last Dose  . ondansetron (ZOFRAN) 4 MG tablet Take 1 tablet (4 mg total) by mouth every 8 (eight) hours as needed for nausea or vomiting. 15 tablet 0 09/29/2015 at 0530  . oxyCODONE-acetaminophen (ROXICET) 5-325 MG tablet Take 1 tablet by mouth every 4 (four) hours as needed for severe pain. 15 tablet 0 09/29/2015 at 0530  . traMADol (ULTRAM) 50 MG tablet Take 1 tablet (50 mg total) by mouth every 6 (six) hours as needed. 9 tablet 0    Home medication reconciliation was completed with the  patient.   Scheduled Inpatient Medications:   . docusate sodium  100 mg Oral BID  . enoxaparin (LOVENOX) injection  40 mg Subcutaneous Q24H  . folic acid  1 mg Oral Daily  . LORazepam  0-4 mg Intravenous Q6H   Followed by  . [START ON 10/01/2015] LORazepam  0-4 mg Intravenous Q12H  . multivitamin with minerals  1 tablet Oral Daily  . nicotine  14 mg Transdermal Daily  . thiamine  100 mg Oral Daily   Or  . thiamine  100 mg Intravenous Daily    Continuous Inpatient Infusions:   . sodium chloride 125 mL/hr at 09/29/15 1919    PRN Inpatient Medications:  acetaminophen **OR** acetaminophen, albuterol, bisacodyl, HYDROcodone-acetaminophen, LORazepam **OR** LORazepam, morphine injection, ondansetron **OR** ondansetron (ZOFRAN) IV, polyethylene glycol  Family History: family history includes Colon cancer in his mother; Leukemia in his other; Lung cancer in his other and sister.    Social History:   reports that he has been smoking Cigarettes.  He has been smoking about 3.00 packs per day. He does not have any smokeless tobacco history on file. He reports that he drinks alcohol. He reports that he uses illicit drugs.   Review of Systems: Constitutional: Weight is stable.  Eyes: No changes in vision. ENT: No oral lesions, sore throat.  GI: see  HPI.  Heme/Lymph: No easy bruising.  CV: +chest pain.  GU: No hematuria.  Integumentary: No rashes.  Neuro: No headaches.  Psych: No depression/anxiety.  Endocrine: No heat/cold intolerance.  Allergic/Immunologic: No urticaria.  Resp: No cough, +SOB.  Musculoskeletal: No joint swelling.    Physical Examination: BP 130/61 mmHg  Pulse 90  Temp(Src) 97.8 F (36.6 C) (Oral)  Resp 17  Ht 5\' 9"  (1.753 m)  Wt 58.968 kg (130 lb)  BMI 19.19 kg/m2  SpO2 96% Gen: NAD, alert and oriented x 4 HEENT: PEERLA, EOMI, Neck: supple, no JVD or thyromegaly Chest: CTA bilaterally, no wheezes, crackles, or other adventitious sounds CV: RRR, no  m/g/c/r Abd: + diffuse TTP, some intentional guarding and valsalva maneuver during exam.  Ext: no edema, well perfused with 2+ pulses, Skin: no rash or lesions noted Lymph: no LAD  Data: Lab Results  Component Value Date   WBC 7.9 09/29/2015   HGB 14.6 09/29/2015   HCT 43.3 09/29/2015   MCV 95.6 09/29/2015   PLT 275 09/29/2015    Recent Labs Lab 09/27/15 2336 09/29/15 1248  HGB 13.7 14.6   Lab Results  Component Value Date   NA 139 09/29/2015   K 5.0 09/29/2015   CL 102 09/29/2015   CO2 30 09/29/2015   BUN 13 09/29/2015   CREATININE 0.77 09/29/2015   Lab Results  Component Value Date   ALT 19 09/29/2015   AST 26 09/29/2015   ALKPHOS 62 09/29/2015   BILITOT 1.1 09/29/2015   No results for input(s): APTT, INR, PTT in the last 168 hours.   Assessment/Plan: Mr. [name] is a 39 y.o. male with abd pain, prev mild elev lipase, CT showing dilation of CBD to 9 mm and PD to 4 mm. Normal liver enzymes. There are some inconsistencies regarding his pain  Saying pain is severe but also starving and requesting food.  Also some voluntary guaring and seeming valsalva maneuver to make abdomen tense during exam.  Heavy ETOH.  Suspect he has chronic pancreatitis with  but agree with MRCP for further evaluation. Depending on MRCP findings, may require ERCP or EUS.   Recommendations; - MRCP as ordered - ERCP vs outpatient EUS depending on MRCP findings.  - watch for inconsistencies regarding pain level, will likely be difficult case to control pain / sort out actual pain   Thank you for the consult. Please call with questions or concerns.  Cecilia Nishikawa, Addison NaegeliMATTHEW GORDON, MD

## 2015-09-29 NOTE — H&P (Signed)
[facility] [place] [facility]   PATIENT NAME: Randy LeydenRoy Abbott    MR#:  [id number]  DATE OF BIRTH:  12/25/1975  DATE OF ADMISSION:  09/29/2015  PRIMARY CARE PHYSICIAN: [facility]   REQUESTING/REFERRING PHYSICIAN: Dr. Inocencio HomesGayle  CHIEF COMPLAINT:   Chief Complaint  Patient presents with  . Abdominal Pain    HISTORY OF PRESENT ILLNESS:  Randy Abbott  is a 39 y.o. male with a known history of asthma, nephrolithiasis presented to the emergency room initially on 09/27/2015 with acute onset of abdominal pain. Patient was found to have acute pancreatitis with lipase of 320 and dilated CBD of 9 mm and dilated pancreatic duct to 4 mm. Patient was advised admission for further workup but left AMA. Today he returns with intractable abdominal pain and nausea. No vomiting. Has been constipated since 09/27/2015. Passing gas. He drinks 2-3, 40 ounce beers daily. He has not had alcohol in the last 2 days. Does use marijuana.   today on lab work his lipase is much improved. Patient is being admitted for intractable abdominal pain and further workup of his dilated CBD.  PAST MEDICAL HISTORY:   Past Medical History  Diagnosis Date  . Kidney stones   . Asthma   . Marijuana abuse   . Tobacco abuse   . Cocaine abuse in remission   . Alcohol abuse     PAST SURGICAL HISTORY:   Past Surgical History  Procedure Laterality Date  . Appendectomy      SOCIAL HISTORY:   Social History  Substance Use Topics  . Smoking status: Current Every Day Smoker -- 3.00 packs/day    Types: Cigarettes  . Smokeless tobacco: Not on file  . Alcohol Use: 0.0 oz/week    0 Standard drinks or equivalent per week     Comment: 2-3 40 OZ beers nightly.    FAMILY HISTORY:   Family History  Problem Relation Age of Onset  . Lung cancer Sister   . Lung cancer Other   . Colon cancer Mother   . Leukemia Other     DRUG ALLERGIES:   Allergies  Allergen Reactions  . Penicillins Hives   . Toradol [Ketorolac Tromethamine] Hives  . Tetracyclines & Related Rash    REVIEW OF SYSTEMS:   Review of Systems  Constitutional: Positive for malaise/fatigue. Negative for fever, chills and weight loss.  HENT: Negative for hearing loss and nosebleeds.   Eyes: Negative for blurred vision, double vision and pain.  Respiratory: Negative for cough, hemoptysis, sputum production, shortness of breath and wheezing.   Cardiovascular: Negative for chest pain, palpitations, orthopnea and leg swelling.  Gastrointestinal: Positive for nausea, abdominal pain and constipation. Negative for vomiting and diarrhea.  Genitourinary: Negative for dysuria and hematuria.  Musculoskeletal: Positive for back pain. Negative for myalgias and falls.  Skin: Negative for rash.  Neurological: Positive for weakness. Negative for dizziness, tremors, sensory change, speech change, focal weakness, seizures and headaches.  Endo/Heme/Allergies: Does not bruise/bleed easily.  Psychiatric/Behavioral: Negative for depression and memory loss. The patient is nervous/anxious and has insomnia.     MEDICATIONS AT HOME:   Prior to Admission medications   Medication Sig Start Date End Date Taking? Authorizing Provider  ondansetron (ZOFRAN) 4 MG tablet Take 1 tablet (4 mg total) by mouth every 8 (eight) hours as needed for nausea or vomiting. 09/28/15  Yes Irean HongJade J Sung, MD  oxyCODONE-acetaminophen (ROXICET) 5-325 MG tablet Take 1 tablet by mouth every 4 (four) hours  as needed for severe pain. 09/28/15  Yes Irean Hong, MD  traMADol (ULTRAM) 50 MG tablet Take 1 tablet (50 mg total) by mouth every 6 (six) hours as needed. 06/08/15   Chinita Pester, FNP      VITAL SIGNS:  Blood pressure 94/60, pulse 52, temperature 97.9 F (36.6 C), temperature source Oral, resp. rate 10, height  (1.753 m), weight 58.968 kg (130 lb), SpO2 98 %.  PHYSICAL EXAMINATION:  Physical Exam  GENERAL:  39 y.o.-year-old patient lying in the bed  with  acute distress due to pain EYES: Pupils equal, round, reactive to light and accommodation. No scleral icterus. Extraocular muscles intact.  HEENT: Head atraumatic, normocephalic. Oropharynx and nasopharynx clear. No oropharyngeal erythema, dry oral mucosa  NECK:  Supple, no jugular venous distention. No thyroid enlargement, no tenderness.  LUNGS: Normal breath sounds bilaterally, no wheezing, rales, rhonchi. No use of accessory muscles of respiration.  CARDIOVASCULAR: S1, S2 normal. No murmurs, rubs, or gallops.  ABDOMEN: Soft, nondistended. Bowel sounds present. No organomegaly or mass. Diffuse tenderness. EXTREMITIES: No pedal edema, cyanosis, or clubbing. + 2 pedal & radial pulses b/l.   NEUROLOGIC: Cranial nerves II through XII are intact. No focal Motor or sensory deficits appreciated b/l PSYCHIATRIC: The patient is alert and oriented x 3. Good affect.  SKIN: No obvious rash, lesion, or ulcer.   LABORATORY PANEL:   CBC  Recent Labs Lab 09/29/15 1248  WBC 7.9  HGB 14.6  HCT 43.3  PLT 275   ------------------------------------------------------------------------------------------------------------------  Chemistries   Recent Labs Lab 09/29/15 1248  NA 139  K 5.0  CL 102  CO2 30  GLUCOSE 98  BUN 13  CREATININE 0.77  CALCIUM 9.3  AST 26  ALT 19  ALKPHOS 62  BILITOT 1.1   ------------------------------------------------------------------------------------------------------------------  Cardiac Enzymes  Recent Labs Lab 09/27/15 2336  TROPONINI <0.03   ------------------------------------------------------------------------------------------------------------------  RADIOLOGY:  Dg Chest 2 View  09/28/2015  CLINICAL DATA:  Acute onset of left-sided chest pain. Cough and shortness of breath. Initial encounter. EXAM: CHEST  2 VIEW COMPARISON:  Chest radiograph performed 05/27/2008 FINDINGS: The lungs are well-aerated and clear. There is no evidence of focal  opacification, pleural effusion or pneumothorax. The heart is normal in size; the mediastinal contour is within normal limits. No acute osseous abnormalities are seen. IMPRESSION: No acute cardiopulmonary process seen. Electronically Signed   By: Roanna Raider M.D.   On: 09/28/2015 00:15   Ct Chest W Contrast  09/28/2015  CLINICAL DATA:  Acute onset of generalized chest pain, shortness of breath and productive cough. Question of fall, with left upper quadrant abdominal pain and left lower rib pain. Initial encounter. EXAM: CT CHEST, ABDOMEN, AND PELVIS WITH CONTRAST TECHNIQUE: Multidetector CT imaging of the chest, abdomen and pelvis was performed following the standard protocol during bolus administration of intravenous contrast. CONTRAST:  OMNIPAQUE IOHEXOL 300 MG/ML  SOLN COMPARISON:  CT of the abdomen and pelvis from 12/10/2009, and chest radiograph performed 09/27/2015 FINDINGS: CT CHEST FINDINGS Minimal right basilar atelectasis is noted. The lungs are otherwise clear. Emphysematous change is noted bilaterally, most prominent in the periphery of the upper lung lobes, with mild biapical scarring. A prominent right-sided bulla is noted anterior to the mediastinum. No pleural effusion or pneumothorax is seen. No masses are identified. There is no evidence of pulmonary parenchymal contusion. The mediastinum is unremarkable in appearance. No mediastinal lymphadenopathy is seen. No pericardial effusion is identified. The great vessels are grossly unremarkable in  appearance. There is no evidence of venous hemorrhage. The visualized portions of the thyroid gland are unremarkable. No axillary lymphadenopathy is seen. No acute osseous abnormalities are identified. CT ABDOMEN PELVIS FINDINGS No free air or free fluid is seen within the abdomen or pelvis. There is no evidence of solid or hollow organ injury. There is diffuse prominence of the intrahepatic biliary ducts, with distention of the common bile duct  to 9 mm, and distention of the pancreatic duct to 4 mm. This raises concern for distal obstruction. The gallbladder is unremarkable in appearance. The pancreas and adrenal glands are otherwise unremarkable. A 6 mm nonobstructing stone is noted at the interpole region of the left kidney. The kidneys are otherwise unremarkable. There is no evidence of hydronephrosis. No renal or ureteral stones are seen. No perinephric stranding is appreciated. The small bowel is unremarkable in appearance. The stomach is within normal limits. No acute vascular abnormalities are seen. Minimal calcification is noted along the distal abdominal aorta and its branches, mildly advanced for age. The patient is status post appendectomy. The colon is unremarkable in appearance. The bladder is mildly distended and grossly unremarkable. The prostate remains borderline normal in size. No inguinal lymphadenopathy is seen. No acute osseous abnormalities are identified. IMPRESSION: 1. No evidence of traumatic injury to the chest, abdomen or pelvis. 2. Diffuse prominence of the intrahepatic biliary ducts, with distention of the common bile duct to 9 mm, and distention of the pancreatic duct to 4 mm. This is concerning for distal obstruction. MRCP or ERCP would be helpful for further evaluation. 3. Minimal right basilar atelectasis noted. Lungs otherwise clear. Emphysematous change noted bilaterally, most prominent in the periphery of the upper lung lobes, with mild biapical scarring. Prominent right-sided bulla noted anterior to the mediastinum. 4. 6 mm nonobstructing stone at the interpole region of the left kidney. 5. Minimal calcification along the distal abdominal aorta and its branches, mildly advanced for age. Electronically Signed   By: Roanna Raider M.D.   On: 09/28/2015 01:21   Ct Abdomen Pelvis W Contrast  09/28/2015  CLINICAL DATA:  Acute onset of generalized chest pain, shortness of breath and productive cough. Question of fall,  with left upper quadrant abdominal pain and left lower rib pain. Initial encounter. EXAM: CT CHEST, ABDOMEN, AND PELVIS WITH CONTRAST TECHNIQUE: Multidetector CT imaging of the chest, abdomen and pelvis was performed following the standard protocol during bolus administration of intravenous contrast. CONTRAST:  OMNIPAQUE IOHEXOL 300 MG/ML  SOLN COMPARISON:  CT of the abdomen and pelvis from 12/10/2009, and chest radiograph performed 09/27/2015 FINDINGS: CT CHEST FINDINGS Minimal right basilar atelectasis is noted. The lungs are otherwise clear. Emphysematous change is noted bilaterally, most prominent in the periphery of the upper lung lobes, with mild biapical scarring. A prominent right-sided bulla is noted anterior to the mediastinum. No pleural effusion or pneumothorax is seen. No masses are identified. There is no evidence of pulmonary parenchymal contusion. The mediastinum is unremarkable in appearance. No mediastinal lymphadenopathy is seen. No pericardial effusion is identified. The great vessels are grossly unremarkable in appearance. There is no evidence of venous hemorrhage. The visualized portions of the thyroid gland are unremarkable. No axillary lymphadenopathy is seen. No acute osseous abnormalities are identified. CT ABDOMEN PELVIS FINDINGS No free air or free fluid is seen within the abdomen or pelvis. There is no evidence of solid or hollow organ injury. There is diffuse prominence of the intrahepatic biliary ducts, with distention of the common bile duct  to 9 mm, and distention of the pancreatic duct to 4 mm. This raises concern for distal obstruction. The gallbladder is unremarkable in appearance. The pancreas and adrenal glands are otherwise unremarkable. A 6 mm nonobstructing stone is noted at the interpole region of the left kidney. The kidneys are otherwise unremarkable. There is no evidence of hydronephrosis. No renal or ureteral stones are seen. No perinephric stranding is  appreciated. The small bowel is unremarkable in appearance. The stomach is within normal limits. No acute vascular abnormalities are seen. Minimal calcification is noted along the distal abdominal aorta and its branches, mildly advanced for age. The patient is status post appendectomy. The colon is unremarkable in appearance. The bladder is mildly distended and grossly unremarkable. The prostate remains borderline normal in size. No inguinal lymphadenopathy is seen. No acute osseous abnormalities are identified. IMPRESSION: 1. No evidence of traumatic injury to the chest, abdomen or pelvis. 2. Diffuse prominence of the intrahepatic biliary ducts, with distention of the common bile duct to 9 mm, and distention of the pancreatic duct to 4 mm. This is concerning for distal obstruction. MRCP or ERCP would be helpful for further evaluation. 3. Minimal right basilar atelectasis noted. Lungs otherwise clear. Emphysematous change noted bilaterally, most prominent in the periphery of the upper lung lobes, with mild biapical scarring. Prominent right-sided bulla noted anterior to the mediastinum. 4. 6 mm nonobstructing stone at the interpole region of the left kidney. 5. Minimal calcification along the distal abdominal aorta and its branches, mildly advanced for age. Electronically Signed   By: Roanna Raider M.D.   On: 09/28/2015 01:21   Dg Abd Acute W/chest  09/29/2015  CLINICAL DATA:  Progressive abdominal pain since Saturday. EXAM: DG ABDOMEN ACUTE W/ 1V CHEST COMPARISON:  CT of the abdomen and pelvis 09/28/2015. FINDINGS: The heart size is normal.  The lungs are clear. Two views of the abdomen demonstrate moderate stool in the distal transverse and descending colon. There is no obstruction. The bowel gas pattern is otherwise normal. The axial skeleton is within normal limits. IMPRESSION: 1. Moderate stool in the distal transverse and descending colon without obstruction. Electronically Signed   By: Marin Roberts M.D.   On: 09/29/2015 14:28     IMPRESSION AND PLAN:   * Acute pancreatitis with dilated CBD and concern for distal CBD obstruction Lipase has normalized. LFTs are normal. Could have passed a stone. Check MRCP Pain control with IV/PO meds. Liquid diet.  * Dehydration - Due to poor PO intake IVF  * Constipation Will check Xray to r/o any ileus/SBO  * Tobacco abuse Counseled > 3 minutes to quit. Stressed the importance considering he already has asthma and family members with lung cancer. Nicotine patch.  * Alcohol abuse CIWA protocol   * DVT prophylaxis Lovenox   All the records are reviewed and case discussed with ED provider. Management plans discussed with the patient, family and they are in agreement.  CODE STATUS: FULL  TOTAL TIME TAKING CARE OF THIS PATIENT: 40 minutes.    Milagros Loll R M.D on 09/29/2015 at 3:26 PM  Between 7am to 6pm - Pager - [phone]  After 6pm go to [email or web address] - password [email or web address] [facility]  [place]  [facility]  Office  [phone]  CC: Primary care physician; [facility]     Note: This dictation was prepared with Dragon dictation along with smaller phrase technology. Any transcriptional errors that result from this process are unintentional.

## 2015-09-29 NOTE — Progress Notes (Signed)
Pt requesting to smoke. Instructed pt that we are smoke free facility and pt should not smoke. Pt refusing to stay in room.

## 2015-09-29 NOTE — ED Provider Notes (Signed)
[facility] Emergency Department Provider Note  ____________________________________________  Time seen: Approximately 1:08 PM  I have reviewed the triage vital signs and the nursing notes.   HISTORY  Chief Complaint Abdominal Pain    HPI Randy Abbott is a 39 y.o. male that she had asthma, kidney stones, alcohol use who presents for evaluation of diffuse abdominal pain constant for 2 days, worsening, no modifying factors. Patient was seen in this emergency department on the evening of 09/27/2015 for chest pain, left-sided abdominal pain, shortness of breath. He underwent CT of the chest abdomen pelvis which showed common bile duct dilatation and distention of the pancreatic duct to 4 mm as well as elevation of his lipase. He reports he had been drinking alcohol but has not had any alcohol to drink for the past 2 days. It was recommend that he be admitted to the hospital for ERCP however he left AGAINST MEDICAL ADVICE. He has had persistent pain, nausea, he has also had constipation. No fevers or chills.   Past Medical History  Diagnosis Date  . Kidney stones   . Asthma     There are no active problems to display for this patient.   Past Surgical History  Procedure Laterality Date  . Appendectomy      Current Outpatient Rx  Name  Route  Sig  Dispense  Refill  . ondansetron (ZOFRAN) 4 MG tablet   Oral   Take 1 tablet (4 mg total) by mouth every 8 (eight) hours as needed for nausea or vomiting.   15 tablet   0   . oxyCODONE-acetaminophen (ROXICET) 5-325 MG tablet   Oral   Take 1 tablet by mouth every 4 (four) hours as needed for severe pain.   15 tablet   0   . traMADol (ULTRAM) 50 MG tablet   Oral   Take 1 tablet (50 mg total) by mouth every 6 (six) hours as needed.   9 tablet   0     Allergies Penicillins; Toradol; and Tetracyclines & related  No family history on file.  Social History Social History  Substance Use Topics  .  Smoking status: Current Every Day Smoker -- 3.00 packs/day    Types: Cigarettes  . Smokeless tobacco: None  . Alcohol Use: Yes     Comment: 2-3 beers nightly    Review of Systems Constitutional: No fever/chills Eyes: No visual changes. ENT: No sore throat. Cardiovascular: Denies chest pain. Respiratory: Denies shortness of breath. Gastrointestinal: + abdominal pain.  + nausea, no vomiting.  No diarrhea.  No constipation. Genitourinary: Negative for dysuria. Musculoskeletal: Negative for back pain. Skin: Negative for rash. Neurological: Negative for headaches, focal weakness or numbness.  10-point ROS otherwise negative.  ____________________________________________   PHYSICAL EXAM:  VITAL SIGNS: ED Triage Vitals  Enc Vitals Group     BP 09/29/15 1246 134/89 mmHg     Pulse Rate 09/29/15 1246 89     Resp 09/29/15 1246 20     Temp 09/29/15 1246 97.9 F (36.6 C)     Temp Source 09/29/15 1246 Oral     SpO2 09/29/15 1246 99 %     Weight 09/29/15 1246 130 lb (58.968 kg)     Height 09/29/15 1246  (1.753 m)     Head Cir --      Peak Flow --      Pain Score 09/29/15 1247 10     Pain Loc --  Pain Edu? --      Excl. in GC? --     Constitutional: Alert and oriented. In severe distress secondary to pain, writhing in bed Eyes: Conjunctivae are normal. PERRL. EOMI. Head: Atraumatic. Nose: No congestion/rhinnorhea. Mouth/Throat: Mucous membranes are moist.  Oropharynx non-erythematous. Neck: No stridor.  Cardiovascular: Normal rate, regular rhythm. Grossly normal heart sounds.  Good peripheral circulation. Respiratory: Normal respiratory effort.  No retractions. Lungs CTAB. Gastrointestinal: With diffuse tenderness to palpation. No CVA tenderness. Genitourinary: deferred Musculoskeletal: No lower extremity tenderness nor edema.  No joint effusions. Neurologic:  Normal speech and language. No gross focal neurologic deficits are appreciated.  Skin:  Skin is warm, dry  and intact. No rash noted. Psychiatric: Mood and affect are normal. Speech and behavior are normal.  ____________________________________________   LABS (all labs ordered are listed, but only abnormal results are displayed)  Labs Reviewed  LIPASE, BLOOD  COMPREHENSIVE METABOLIC PANEL  CBC  URINALYSIS COMPLETEWITH MICROSCOPIC ([facility] ONLY)   ____________________________________________  EKG  none  ____________________________________________  RADIOLOGY  3 view abdomen plain films IMPRESSION: 1. Moderate stool in the distal transverse and descending colon without obstruction. ____________________________________________   PROCEDURES  Procedure(s) performed: None  Critical Care performed: No  ____________________________________________   INITIAL IMPRESSION / ASSESSMENT AND PLAN / ED COURSE  Pertinent labs & imaging results that were available during my care of the patient were reviewed by me and considered in my medical decision making (see chart for details).  Randy Abbott is a 39 y.o. male that she had asthma, kidney stones, alcohol use who presents for evaluation of diffuse abdominal pain constant for 2 days, worsening, no modifying factors. On arrival to the emergency department he is writhing in pain, in distress. Diffuse abdominal tenderness to palpation. Vital signs are stable, he is afebrile. We will give pain control, obtain basic labs, discuss with GI.  ----------------------------------------- 3:19 PM on 09/29/2015 ----------------------------------------- Labs reviewed. Normal CMP, CBC, lipase. Case discussed with Dr. Shelle Ironein of GI who recommends admission for ERCP. Case discussed with hospitalist, Dr. Elpidio AnisSudini, for admission at this time.  ____________________________________________   FINAL CLINICAL IMPRESSION(S) / ED DIAGNOSES  Final diagnoses:  Generalized abdominal pain  Biliary obstruction      Gayla DossEryka A Skyann Ganim, MD 09/29/15 1521

## 2015-09-29 NOTE — ED Notes (Signed)
Pt to ed with reports that he was seen here 2 days ago for abd pain, reports that he left ama. Pt was diagnosed with acute pancreatitis and they wanted to admit him to find what was obstructing.

## 2015-09-30 ENCOUNTER — Inpatient Hospital Stay: Payer: Medicaid Other

## 2015-09-30 LAB — URINE DRUG SCREEN, QUALITATIVE (ARMC ONLY)
AMPHETAMINES, UR SCREEN: NOT DETECTED
BARBITURATES, UR SCREEN: NOT DETECTED
BENZODIAZEPINE, UR SCRN: POSITIVE — AB
Cannabinoid 50 Ng, Ur ~~LOC~~: NOT DETECTED
Cocaine Metabolite,Ur ~~LOC~~: NOT DETECTED
MDMA (Ecstasy)Ur Screen: NOT DETECTED
METHADONE SCREEN, URINE: NOT DETECTED
Opiate, Ur Screen: POSITIVE — AB
Phencyclidine (PCP) Ur S: NOT DETECTED
Tricyclic, Ur Screen: NOT DETECTED

## 2015-09-30 LAB — COMPREHENSIVE METABOLIC PANEL
ALK PHOS: 50 U/L (ref 38–126)
ALT: 15 U/L — AB (ref 17–63)
AST: 19 U/L (ref 15–41)
Albumin: 3.3 g/dL — ABNORMAL LOW (ref 3.5–5.0)
Anion gap: 5 (ref 5–15)
BUN: 9 mg/dL (ref 6–20)
CALCIUM: 8.8 mg/dL — AB (ref 8.9–10.3)
CO2: 28 mmol/L (ref 22–32)
CREATININE: 0.66 mg/dL (ref 0.61–1.24)
Chloride: 107 mmol/L (ref 101–111)
GFR calc non Af Amer: >60 mL/min (ref >60)
GLUCOSE: 84 mg/dL (ref 65–99)
Potassium: 4 mmol/L (ref 3.5–5.1)
SODIUM: 140 mmol/L (ref 135–145)
Total Bilirubin: 1.1 mg/dL (ref 0.3–1.2)
Total Protein: 5.6 g/dL — ABNORMAL LOW (ref 6.5–8.1)

## 2015-09-30 MED ORDER — LORAZEPAM 1 MG PO TABS: 1.0000 mg | ORAL_TABLET | Freq: Two times a day (BID) | ORAL | Status: AC | PRN

## 2015-09-30 MED ORDER — GADOBENATE DIMEGLUMINE 529 MG/ML IV SOLN
15.0000 mL | Freq: Once | INTRAVENOUS | Status: AC | PRN
  Administered 2015-09-30: 15 mL via INTRAVENOUS

## 2015-09-30 MED ORDER — ADULT MULTIVITAMIN W/MINERALS CH: 1.0000 | ORAL_TABLET | Freq: Every day | ORAL | Status: AC

## 2015-09-30 MED ORDER — FOLIC ACID 1 MG PO TABS: 1.0000 mg | ORAL_TABLET | Freq: Every day | ORAL | Status: AC

## 2015-09-30 NOTE — Discharge Instructions (Signed)
Stop drinking alcohol!! °

## 2015-09-30 NOTE — Progress Notes (Addendum)
Initial Nutrition Assessment    INTERVENTION:   Coordination of Care: await diet progression as medically able   NUTRITION DIAGNOSIS:   Inadequate oral intake related to inability to eat as evidenced by  (NPO/CL diet order).  GOAL:   Patient will meet greater than or equal to 90% of their needs  MONITOR:    (Energy Intake, Anthropometrics, Digestive System)  REASON FOR ASSESSMENT:   Diagnosis    ASSESSMENT:   Pt admitted with acute pancreatitis secondary to dialated CBD and pancreatic duct per MD note. Pt scheduled for MRCP. Pt with h/o EtOH use on CIWA. Pt unavailable times 2 on rounds today.  Past Medical History  Diagnosis Date  . Kidney stones   . Asthma   . Marijuana abuse   . Tobacco abuse   . Cocaine abuse in remission   . Alcohol abuse      Diet Order:  Diet clear liquid Room service appropriate?: Yes; Fluid consistency:: Thin    Current Nutrition: NPO this am.   Food/Nutrition-Related History: Per MST no decrease in appetite PTA.   Scheduled Medications:  . docusate sodium  100 mg Oral BID  . enoxaparin (LOVENOX) injection  40 mg Subcutaneous Q24H  . folic acid  1 mg Oral Daily  . LORazepam  0-4 mg Intravenous Q6H   Followed by  . [START ON 10/01/2015] LORazepam  0-4 mg Intravenous Q12H  . multivitamin with minerals  1 tablet Oral Daily  . nicotine  14 mg Transdermal Daily  . thiamine  100 mg Oral Daily   Or  . thiamine  100 mg Intravenous Daily    Continuous Medications:  . sodium chloride 125 mL/hr at 09/30/15 1438     Electrolyte/Renal Profile and Glucose Profile:   Recent Labs Lab 09/27/15 2336 09/29/15 1248 09/30/15 0456  NA 141 139 140  K 3.9 5.0 4.0  CL 109 102 107  CO2 25 30 28   BUN 11 13 9   CREATININE 0.81 0.77 0.66  CALCIUM 8.7* 9.3 8.8*  GLUCOSE 95 98 84   Protein Profile:  Recent Labs Lab 09/27/15 2336 09/29/15 1248 09/30/15 0456  ALBUMIN 4.2 4.3 3.3*   Lipase     Component Value Date/Time   LIPASE 28  09/29/2015 1248   RD notes Lipase of 392 on admission   Gastrointestinal Profile: Last BM:  09/27/2015   Weight Change: Per MST no decrease in weight PTA   Height:   Ht Readings from Last 1 Encounters:  09/29/15 5\' 9"  (1.753 m)    Weight:   Wt Readings from Last 1 Encounters:  09/29/15 130 lb (58.968 kg)   Wt Readings from Last 10 Encounters:  09/29/15 130 lb (58.968 kg)  09/27/15 130 lb (58.968 kg)  06/08/15 140 lb (63.504 kg)     BMI:  Body mass index is 19.19 kg/(m^2).   EDUCATION NEEDS:   Education needs no appropriate at this time   LOW Care Level  Leda QuailAllyson Delaila Nand, [place]RD, LDN Pager [phone]

## 2015-09-30 NOTE — Discharge Summary (Signed)
[facility] [place] [facility]   PATIENT NAME: Randy Abbott    MR#:  [id number]  DATE OF BIRTH:  05/16/1976  DATE OF ADMISSION:  09/29/2015 ADMITTING PHYSICIAN: Milagros LollSrikar Sudini, MD  DATE OF DISCHARGE: 09/30/15  PRIMARY CARE PHYSICIAN: [facility]    ADMISSION DIAGNOSIS:  Generalized abdominal pain [R10.84] Biliary obstruction [K83.1] Dilated cbd, acquired [K83.8] Constipation [K59.00]  DISCHARGE DIAGNOSIS:  Abdominal pain-etio unclear Ongoing alcohol, tobacco and drug abuse  SECONDARY DIAGNOSIS:   Past Medical History  Diagnosis Date  . Kidney stones   . Asthma   . Marijuana abuse   . Tobacco abuse   . Cocaine abuse in remission   . Alcohol abuse     HOSPITAL COURSE:   * Acute pancreatitis with dilated CBD and concern for distal CBD obstruction Lipase has normalized. LFTs are normal. Could have passed a stone.  MRCP neg for CBD dilation. Pain control with PO meds. Liquid diet. Advance as tolerated. Seen by Dr Talbot Grumblingrrein. No further w/u needed Pt requesting to go home  * Dehydration - Due to poor PO intake Received IVF. Appears euvolemic  * Tobacco abuse Counseled > 3 minutes to quit. Stressed the importance considering he already has asthma and family members with lung cancer. Nicotine patch.  * Alcohol abuse CIWA protocol -will give a few ativan to go home with. Advised abstinence  * DVT prophylaxis  D/c home CONSULTS OBTAINED:  Treatment Team:  Elnita MaxwellMatthew Gordon Rein, MD  DRUG ALLERGIES:   Allergies  Allergen Reactions  . Penicillins Hives  . Toradol [Ketorolac Tromethamine] Hives  . Tetracyclines & Related Rash    DISCHARGE MEDICATIONS:   Current Discharge Medication List    START taking these medications   Details  folic acid (FOLVITE) 1 MG tablet Take 1 tablet (1 mg total) by mouth daily. Qty: 30 tablet, Refills: 0    LORazepam (ATIVAN) 1 MG tablet Take 1 tablet (1 mg total) by mouth 2 (two) times daily  as needed for anxiety (CIWA-AR > 8-OR-withdrawal symptoms:anxiety, agitation, insomnia, diaphoresis, nausea, vomiting, tremors, tachycardia, or hypertension.). Qty: 20 tablet, Refills: 0    Multiple Vitamin (MULTIVITAMIN WITH MINERALS) TABS tablet Take 1 tablet by mouth daily. Qty: 30 tablet, Refills: 0      CONTINUE these medications which have NOT CHANGED   Details  ondansetron (ZOFRAN) 4 MG tablet Take 1 tablet (4 mg total) by mouth every 8 (eight) hours as needed for nausea or vomiting. Qty: 15 tablet, Refills: 0    oxyCODONE-acetaminophen (ROXICET) 5-325 MG tablet Take 1 tablet by mouth every 4 (four) hours as needed for severe pain. Qty: 15 tablet, Refills: 0    traMADol (ULTRAM) 50 MG tablet Take 1 tablet (50 mg total) by mouth every 6 (six) hours as needed. Qty: 9 tablet, Refills: 0        If you experience worsening of your admission symptoms, develop shortness of breath, life threatening emergency, suicidal or homicidal thoughts you must seek medical attention immediately by calling 911 or calling your MD immediately  if symptoms less severe.  You Must read complete instructions/literature along with all the possible adverse reactions/side effects for all the Medicines you take and that have been prescribed to you. Take any new Medicines after you have completely understood and accept all the possible adverse reactions/side effects.   Please note  You were cared for by a hospitalist during your hospital stay. If you have any questions about your discharge medications or  the care you received while you were in the hospital after you are discharged, you can call the unit and asked to speak with the hospitalist on call if the hospitalist that took care of you is not available. Once you are discharged, your primary care physician will handle any further medical issues. Please note that NO REFILLS for any discharge medications will be authorized once you are discharged, as it is  imperative that you return to your primary care physician (or establish a relationship with a primary care physician if you do not have one) for your aftercare needs so that they can reassess your need for medications and monitor your lab values. Today   SUBJECTIVE  Hungry [place] to smoke. No tremors earlier  VITAL SIGNS:  Blood pressure 106/72, pulse 70, temperature 97.8 F (36.6 C), temperature source Oral, resp. rate 18, height  (1.753 m), weight 130 lb (58.968 kg), SpO2 96 %.  I/O:   Intake/Output Summary (Last 24 hours) at 09/30/15 1655 Last data filed at 09/30/15 1200  Gross per 24 hour  Intake 3147.39 ml  Output   2050 ml  Net 1097.39 ml    PHYSICAL EXAMINATION:  GENERAL:  39 y.o.-year-old patient lying in the bed with no acute distress. Thin, disheveled EYES: Pupils equal, round, reactive to light and accommodation. No scleral icterus. Extraocular muscles intact.  HEENT: Head atraumatic, normocephalic. Oropharynx and nasopharynx clear. Poor dentition NECK:  Supple, no jugular venous distention. No thyroid enlargement, no tenderness.  LUNGS: Normal breath sounds bilaterally, no wheezing, rales,rhonchi or crepitation. No use of accessory muscles of respiration.  CARDIOVASCULAR: S1, S2 normal. No murmurs, rubs, or gallops.  ABDOMEN: Soft, diffuse mild-tender, non-distended. Bowel sounds present. No organomegaly or mass.  EXTREMITIES: No pedal edema, cyanosis, or clubbing.  NEUROLOGIC: Cranial nerves II through XII are intact. Muscle strength 5/5 in all extremities. Sensation intact. Gait not checked.  PSYCHIATRIC: The patient is alert and oriented x 3.  SKIN: No obvious rash, lesion, or ulcer.   DATA REVIEW:   CBC   Recent Labs Lab 09/29/15 1248  WBC 7.9  HGB 14.6  HCT 43.3  PLT 275    Chemistries   Recent Labs Lab 09/30/15 0456  NA 140  K 4.0  CL 107  CO2 28  GLUCOSE 84  BUN 9  CREATININE 0.66  CALCIUM 8.8*  AST 19  ALT 15*  ALKPHOS 50   BILITOT 1.1    Microbiology Results   No results found for this or any previous visit (from the past 240 hour(s)).  RADIOLOGY:  Dg Abd 1 View  09/29/2015  CLINICAL DATA:  Abdominal pain. Initial presentation on 09/27/2015 with acute onset abdominal pain. Patient was found toe path acute pancreatitis. Patient was advised admission but left AMA. Patient returns today with intractable abdominal pain and nausea. EXAM: ABDOMEN - 1 VIEW COMPARISON:  09/29/2015 FINDINGS: Scattered gas and stool throughout the colon. No small or large bowel distention. No radiopaque stones. Visualized bones appear intact. IMPRESSION: Normal nonobstructive bowel gas pattern. Electronically Signed   By: Burman Nieves M.D.   On: 09/29/2015 19:06   Dg Abd Acute W/chest  09/29/2015  CLINICAL DATA:  Progressive abdominal pain since Saturday. EXAM: DG ABDOMEN ACUTE W/ 1V CHEST COMPARISON:  CT of the abdomen and pelvis 09/28/2015. FINDINGS: The heart size is normal.  The lungs are clear. Two views of the abdomen demonstrate moderate stool in the distal transverse and descending colon. There is no obstruction. The bowel gas  pattern is otherwise normal. The axial skeleton is within normal limits. IMPRESSION: 1. Moderate stool in the distal transverse and descending colon without obstruction. Electronically Signed   By: Marin Roberts M.D.   On: 09/29/2015 14:28   Mr Abd W/wo Cm/mrcp  09/30/2015  CLINICAL DATA:  Followup acute pancreatitis. Dilated common bile duct and mild dilated pancreatic duct. EXAM: MRI ABDOMEN WITHOUT AND WITH CONTRAST (INCLUDING MRCP) TECHNIQUE: Multiplanar multisequence MR imaging of the abdomen was performed both before and after the administration of intravenous contrast. Heavily T2-weighted images of the biliary and pancreatic ducts were obtained, and three-dimensional MRCP images were rendered by post processing. CONTRAST:  15mL MULTIHANCE GADOBENATE DIMEGLUMINE 529 MG/ML IV SOLN COMPARISON:   CT 09/28/2015 FINDINGS: Lower chest:  No pleural fluid or pericardial fluid. Hepatobiliary: No intrahepatic biliary duct dilatation. The common hepatic duct and common bile duct are upper limits normal at 67 mm (image 27 and image 26 of series 7). No filling defect within the common bile duct. No external compression. Gallbladder is normal. No gallstones identified. The small lesion in the central RIGHT hepatic lobe measuring 10 mm which is hyperintense on T2 weighted imaging. Postcontrast imaging is severely degraded by patient respiratory motion. Pancreas: Normal pancreatic parenchymal intensity. No ductal dilatation or inflammation. Spleen: Normal spleen. Adrenals/urinary tract: Adrenal glands and kidneys are normal. Stomach/Bowel: Stomach and limited of the small bowel is unremarkable Vascular/Lymphatic: Abdominal aortic normal caliber. No retroperitoneal periportal lymphadenopathy. Musculoskeletal: No aggressive osseous lesion IMPRESSION: 1. Of note MRI is not well suited for patient who could not suspend respiration due to pain or otherwise. Significant motion degradation on the post-contrast imaging. 2. No significant intra or extrahepatic biliary duct dilatation. Mild duct dilatation is likely sequelae of prior pancreatitis. No filling defect within the common bile duct or external compression. 3. Pancreatic duct is mildly dilated without obstructing lesion identified. 4. Probable cyst within the RIGHT hepatic lobe. Post-contrast images degraded. 5. Small amount fluid along the pericolic gutters likely relates prior pancreatitis. No organized fluid collections. 6. No Pleural fluid or pericardial fluid Electronically Signed   By: Genevive Bi M.D.   On: 09/30/2015 14:51     Management plans discussed with the patient, family and they are in agreement.  CODE STATUS:     Code Status Orders        Start     Ordered   09/29/15 1522  Full code   Continuous     09/29/15 1522      TOTAL TIME  TAKING CARE OF THIS PATIENT:  .    Kareema Keitt M.D on 09/30/2015 at 4:55 PM  Between 7am to 6pm - Pager - [phone] After 6pm go to [email or web address] - password [email or web address] [facility]  [place] [facility]  Office  [phone]  CC: Primary care physician; [facility]

## 2015-10-01 ENCOUNTER — Encounter: Payer: Self-pay | Admitting: Emergency Medicine

## 2015-10-01 ENCOUNTER — Emergency Department
Admission: EM | Admit: 2015-10-01 | Discharge: 2015-10-01 | Disposition: A | Discharge status: Home / Self Care | Payer: Medicaid Other | Attending: Emergency Medicine | Admitting: Emergency Medicine

## 2015-10-01 DIAGNOSIS — F17210 Nicotine dependence, cigarettes, uncomplicated: Secondary | ICD-10-CM | POA: Diagnosis not present

## 2015-10-01 DIAGNOSIS — R1084 Generalized abdominal pain: Secondary | ICD-10-CM | POA: Insufficient documentation

## 2015-10-01 DIAGNOSIS — Z880 Allergy status to penicillin: Secondary | ICD-10-CM | POA: Diagnosis not present

## 2015-10-01 DIAGNOSIS — R531 Weakness: Secondary | ICD-10-CM | POA: Insufficient documentation

## 2015-10-01 DIAGNOSIS — F10230 Alcohol dependence with withdrawal, uncomplicated: Secondary | ICD-10-CM | POA: Diagnosis not present

## 2015-10-01 DIAGNOSIS — F10930 Alcohol use, unspecified with withdrawal, uncomplicated: Secondary | ICD-10-CM

## 2015-10-01 DIAGNOSIS — R109 Unspecified abdominal pain: Secondary | ICD-10-CM | POA: Diagnosis present

## 2015-10-01 LAB — CBC WITH DIFFERENTIAL/PLATELET
Basophils Absolute: 0.1 10*3/uL (ref 0–0.1)
Basophils Relative: 1 %
EOS ABS: 0.2 10*3/uL (ref 0–0.7)
EOS PCT: 3 %
HCT: 40.2 % (ref 40.0–52.0)
Hemoglobin: 13.3 g/dL (ref 13.0–18.0)
LYMPHS ABS: 1.9 10*3/uL (ref 1.0–3.6)
LYMPHS PCT: 25 %
MCH: 31.4 pg (ref 26.0–34.0)
MCHC: 33 g/dL (ref 32.0–36.0)
MCV: 95.2 fL (ref 80.0–100.0)
MONOS PCT: 6 %
Monocytes Absolute: 0.5 10*3/uL (ref 0.2–1.0)
Neutro Abs: 4.9 10*3/uL (ref 1.4–6.5)
Neutrophils Relative %: 65 %
PLATELETS: 252 10*3/uL (ref 150–440)
RBC: 4.23 MIL/uL — AB (ref 4.40–5.90)
RDW: 13.1 % (ref 11.5–14.5)
WBC: 7.6 10*3/uL (ref 3.8–10.6)

## 2015-10-01 LAB — URINALYSIS COMPLETE WITH MICROSCOPIC (ARMC ONLY)
Bilirubin Urine: NEGATIVE
GLUCOSE, UA: NEGATIVE mg/dL
HGB URINE DIPSTICK: NEGATIVE
Ketones, ur: NEGATIVE mg/dL
Leukocytes, UA: NEGATIVE
Nitrite: NEGATIVE
PROTEIN: NEGATIVE mg/dL
RBC / HPF: NONE SEEN RBC/hpf (ref 0–5)
SQUAMOUS EPITHELIAL / LPF: NONE SEEN
Specific Gravity, Urine: 1.006 (ref 1.005–1.030)
pH: 8 (ref 5.0–8.0)

## 2015-10-01 LAB — LIPASE, BLOOD: LIPASE: 29 U/L (ref 11–51)

## 2015-10-01 LAB — COMPREHENSIVE METABOLIC PANEL
ALBUMIN: 4 g/dL (ref 3.5–5.0)
ALT: 18 U/L (ref 17–63)
AST: 22 U/L (ref 15–41)
Alkaline Phosphatase: 66 U/L (ref 38–126)
Anion gap: 4 — ABNORMAL LOW (ref 5–15)
BUN: 11 mg/dL (ref 6–20)
CHLORIDE: 107 mmol/L (ref 101–111)
CO2: 29 mmol/L (ref 22–32)
CREATININE: 0.63 mg/dL (ref 0.61–1.24)
Calcium: 9.2 mg/dL (ref 8.9–10.3)
GFR calc Af Amer: >60 mL/min (ref >60)
GLUCOSE: 84 mg/dL (ref 65–99)
POTASSIUM: 4.2 mmol/L (ref 3.5–5.1)
SODIUM: 140 mmol/L (ref 135–145)
Total Bilirubin: 0.4 mg/dL (ref 0.3–1.2)
Total Protein: 6.4 g/dL — ABNORMAL LOW (ref 6.5–8.1)

## 2015-10-01 MED ORDER — DIPHENHYDRAMINE HCL 50 MG/ML IJ SOLN
25.0000 mg | Freq: Once | INTRAMUSCULAR | Status: AC
  Administered 2015-10-01: 25 mg via INTRAVENOUS
  Filled 2015-10-01: qty 1

## 2015-10-01 MED ORDER — KETOROLAC TROMETHAMINE 30 MG/ML IJ SOLN
30.0000 mg | Freq: Once | INTRAMUSCULAR | Status: AC
  Administered 2015-10-01: 30 mg via INTRAVENOUS
  Filled 2015-10-01: qty 1

## 2015-10-01 MED ORDER — PROMETHAZINE HCL 25 MG PO TABS: 25.0000 mg | ORAL_TABLET | Freq: Four times a day (QID) | ORAL | Status: AC | PRN

## 2015-10-01 MED ORDER — CYCLOBENZAPRINE HCL 10 MG PO TABS: 10.0000 mg | ORAL_TABLET | Freq: Three times a day (TID) | ORAL | Status: AC | PRN

## 2015-10-01 NOTE — Discharge Instructions (Signed)
Abdominal Pain, Adult Many things can cause abdominal pain. Usually, abdominal pain is not caused by a disease and will improve without treatment. It can often be observed and treated at home. Your health care provider will do a physical exam and possibly order blood tests and X-rays to help determine the seriousness of your pain. However, in many cases, more time must pass before a clear cause of the pain can be found. Before that point, your health care provider may not know if you need more testing or further treatment. HOME CARE INSTRUCTIONS Monitor your abdominal pain for any changes. The following actions may help to alleviate any discomfort you are experiencing:  Only take over-the-counter or prescription medicines as directed by your health care provider.  Do not take laxatives unless directed to do so by your health care provider.  Try a clear liquid diet (broth, tea, or water) as directed by your health care provider. Slowly move to a bland diet as tolerated. SEEK MEDICAL CARE IF:  You have unexplained abdominal pain.  You have abdominal pain associated with nausea or diarrhea.  You have pain when you urinate or have a bowel movement.  You experience abdominal pain that wakes you in the night.  You have abdominal pain that is worsened or improved by eating food.  You have abdominal pain that is worsened with eating fatty foods.  You have a fever. SEEK IMMEDIATE MEDICAL CARE IF:  Your pain does not go away within 2 hours.  You keep throwing up (vomiting).  Your pain is felt only in portions of the abdomen, such as the right side or the left lower portion of the abdomen.  You pass bloody or black tarry stools. MAKE SURE YOU:  Understand these instructions.  Will watch your condition.  Will get help right away if you are not doing well or get worse.   This information is not intended to replace advice given to you by your health care provider. Make sure you discuss  any questions you have with your health care provider.   Document Released: 07/13/2005 Document Revised: 06/24/2015 Document Reviewed: 06/12/2013 Elsevier Interactive Patient Education 2016 [employer].  Alcohol Withdrawal Alcohol withdrawal is a group of symptoms that can develop when a person who drinks heavily and regularly stops drinking or drinks less. CAUSES Heavy and regular drinking can cause chemicals that send signals from the brain to the body (neurotransmitters) to deactivate. Alcohol withdrawal develops when deactivated neurotransmitters reactivate because a person stops drinking or drinks less. RISK FACTORS The more a person drinks and the longer he or she drinks, the greater the risk of alcohol withdrawal. Severe withdrawal is more likely to develop in someone who:  Had severe alcohol withdrawal in the past.  Had a seizure during a previous episode of alcohol withdrawal.  Is elderly.  Is pregnant.  Has been abusing drugs.  Has other medical problems, including:  Infection.  Heart, lung, or liver disease.  Seizures.  Mental health problems. SYMPTOMS Symptoms of this condition can be mild to moderate, or they can be severe. Mild to moderate symptoms may include:  Fatigue.  Nightmares.  Trouble sleeping.  Depression.  Anxiety.  Inability to think clearly.  Mood swings.  Irritability.  Loss of appetite.  Nausea or vomiting.  Clammy skin.  Extreme sweating.  Rapid heartbeat.  Shakiness.  Uncontrollable shaking (tremor). Severe symptoms may include:  Fever.  Seizures.  Severeconfusion.  Feeling or seeing things that are not there (hallucinations). Symptoms usually  begin within eight hours after a person stops drinking or drinks less. They can last for weeks. DIAGNOSIS Alcohol withdrawal is diagnosed with a medical history and physical exam. Sometimes, urine and blood tests are also done. TREATMENT Treatment may  involve:  Monitoring blood pressure, pulse, and breathing.  Getting fluids through an IV tube.  Medicine to reduce anxiety.  Medicine to prevent or control seizures.  Multivitamins and B vitamins.  Having a health care provider check on you daily. If symptoms are moderate to severe or if there is a risk of severe withdrawal, treatment may be done at a hospital or treatment center. HOME CARE INSTRUCTIONS  Take medicines and vitamin supplements only as directed by your health care provider.  Do not drink alcohol.  Have someone stay with you or be available if you need help.  Drink enough fluid to keep your urine clear or pale yellow.  Consider joining a 12-step program or another alcohol support group. SEEK MEDICAL CARE IF:  Your symptoms get worse or do not go away.  You cannot keep food or water in your stomach.  You are struggling with not drinking alcohol.  You cannot stop drinking alcohol. SEEK IMMEDIATE MEDICAL CARE IF:   You have an irregular heartbeat.  You have chest pain.  You have trouble breathing.  You have symptoms of severe withdrawal, such as:  A fever.  Seizures.  Severe confusion.  Hallucinations.   This information is not intended to replace advice given to you by your health care provider. Make sure you discuss any questions you have with your health care provider.   Document Released: 07/13/2005 Document Revised: 10/24/2014 Document Reviewed: 07/22/2014 Elsevier Interactive Patient Education [employer]2016 [employer]vier Inc.

## 2015-10-01 NOTE — ED Provider Notes (Signed)
[facility] Emergency Department Provider Note     Time seen: ----------------------------------------- 12:44 PM on 10/01/2015 -----------------------------------------    I have reviewed the triage vital signs and the nursing notes.   HISTORY  Chief Complaint Abdominal Pain    HPI Randy Abbott is a 39 y.o. male who presents ER with severe abdominal pain. Patient states she was just discharged from the hospital yesterday after being diagnosed with pancreatitis. Patient reports a long history of alcohol abuse to which she drank 4-640 ounce beers a day. Patient also has a remote history of cocaine abuse but stopped about 10 years ago. He was admitted the hospital for this pancreatitis, MRCP was normal. This was felt to be alcohol induced. Patient states he hasn't had alcohol in the last 4 days. He is having withdrawal symptoms but mostly is just complaining of severe abdominal pain.   Past Medical History  Diagnosis Date  . Kidney stones   . Asthma   . Marijuana abuse   . Tobacco abuse   . Cocaine abuse in remission   . Alcohol abuse     Patient Active Problem List   Diagnosis Date Noted  . Acute pancreatitis 09/29/2015    Past Surgical History  Procedure Laterality Date  . Appendectomy      Allergies Penicillins; Toradol; and Tetracyclines & related  Social History Social History  Substance Use Topics  . Smoking status: Current Every Day Smoker -- 3.00 packs/day    Types: Cigarettes  . Smokeless tobacco: None  . Alcohol Use: 0.0 oz/week    0 Standard drinks or equivalent per week     Comment: 2-3 40 OZ beers nightly.    Review of Systems Constitutional: Negative for fever. Eyes: Negative for visual changes. ENT: Negative for sore throat. Cardiovascular: Negative for chest pain. Respiratory: Negative for shortness of breath. Gastrointestinal: Positive for abdominal pain Genitourinary: Negative for dysuria. Musculoskeletal:  Negative for back pain. Skin: Negative for rash. Neurological: Negative for headaches, positive for weakness  10-point ROS otherwise negative.  ____________________________________________   PHYSICAL EXAM:  VITAL SIGNS: ED Triage Vitals  Enc Vitals Group     BP 10/01/15 1221 122/87 mmHg     Pulse Rate 10/01/15 1221 80     Resp 10/01/15 1221 18     Temp 10/01/15 1221 97.6 F (36.4 C)     Temp Source 10/01/15 1221 Oral     SpO2 10/01/15 1221 99 %     Weight 10/01/15 1221 130 lb (58.968 kg)     Height 10/01/15 1221  (1.753 m)     Head Cir --      Peak Flow --      Pain Score 10/01/15 1222 9     Pain Loc --      Pain Edu? --      Excl. in GC? --     Constitutional: Alert and oriented. Mild distress Eyes: Conjunctivae are normal. PERRL. Normal extraocular movements. ENT   Head: Normocephalic and atraumatic.   Nose: No congestion/rhinnorhea.   Mouth/Throat: Mucous membranes are moist.   Neck: No stridor. Cardiovascular: Normal rate, regular rhythm. Normal and symmetric distal pulses are present in all extremities. No murmurs, rubs, or gallops. Respiratory: Normal respiratory effort without tachypnea nor retractions. Breath sounds are clear and equal bilaterally. No wheezes/rales/rhonchi. Gastrointestinal: Soft and nontender. No distention. No abdominal bruits. Normal bowel sounds Musculoskeletal: Nontender with normal range of motion in all extremities. No joint effusions.  No lower  extremity tenderness nor edema. Neurologic:  Normal speech and language. No gross focal neurologic deficits are appreciated. Speech is normal. Skin:  Skin is warm, dry and intact. No rash noted. Psychiatric: Mood and affect are normal. Speech and behavior are normal. Patient exhibits appropriate insight and judgment. ____________________________________________  EKG: Interpreted by me. Sinus rhythm with sinus arrhythmia. Rate is 82 bpm, normal QRS with, normal QT interval. Normal  axis.  ____________________________________________  ED COURSE:  Pertinent labs & imaging results that were available during my care of the patient were reviewed by me and considered in my medical decision making (see chart for details). Patient is in no acute distress, likely still with pancreatitis and pain secondary to that. MRCP was negative. ____________________________________________    LABS (pertinent positives/negatives)  Labs Reviewed  COMPREHENSIVE METABOLIC PANEL - Abnormal; Notable for the following:    Total Protein 6.4 (*)    Anion gap 4 (*)    All other components within normal limits  CBC WITH DIFFERENTIAL/PLATELET - Abnormal; Notable for the following:    RBC 4.23 (*)    All other components within normal limits  URINALYSIS COMPLETEWITH MICROSCOPIC ([facility] ONLY) - Abnormal; Notable for the following:    Color, Urine STRAW (*)    APPearance CLEAR (*)    Bacteria, UA RARE (*)    All other components within normal limits  LIPASE, BLOOD   ____________________________________________  FINAL ASSESSMENT AND PLAN  Abdominal pain, alcohol withdrawal  Plan: Patient with labs and imaging as dictated above. Patient is likely symptomatic from alcohol withdrawal as well as recent pancreatitis. His labs today are normal. I will advise over-the-counter pain medicine for him as well as antiemetics and Flexeril for withdrawal symptoms.   Emily FilbertWilliams, Cerissa Zeiger E, MD   Emily FilbertJonathan E Amilia Vandenbrink, MD 10/01/15 [phone]

## 2015-10-01 NOTE — ED Notes (Signed)
Pt to ED with c/o LUQ abd pain, states he was just discharged from hospital yesterday after having pancreatitis, states pain has not got any better

## 2015-10-06 ENCOUNTER — Emergency Department
Admission: EM | Admit: 2015-10-06 | Discharge: 2015-10-06 | Disposition: A | Discharge status: Home / Self Care | Payer: Medicaid Other | Attending: Emergency Medicine | Admitting: Emergency Medicine

## 2015-10-06 DIAGNOSIS — F17210 Nicotine dependence, cigarettes, uncomplicated: Secondary | ICD-10-CM | POA: Diagnosis not present

## 2015-10-06 DIAGNOSIS — R109 Unspecified abdominal pain: Secondary | ICD-10-CM | POA: Insufficient documentation

## 2015-10-06 DIAGNOSIS — Z79899 Other long term (current) drug therapy: Secondary | ICD-10-CM | POA: Insufficient documentation

## 2015-10-06 DIAGNOSIS — G8929 Other chronic pain: Secondary | ICD-10-CM | POA: Insufficient documentation

## 2015-10-06 DIAGNOSIS — R1110 Vomiting, unspecified: Secondary | ICD-10-CM | POA: Insufficient documentation

## 2015-10-06 DIAGNOSIS — R1032 Left lower quadrant pain: Secondary | ICD-10-CM | POA: Diagnosis not present

## 2015-10-06 DIAGNOSIS — K859 Acute pancreatitis without necrosis or infection, unspecified: Secondary | ICD-10-CM | POA: Insufficient documentation

## 2015-10-06 DIAGNOSIS — R1012 Left upper quadrant pain: Secondary | ICD-10-CM | POA: Diagnosis present

## 2015-10-06 DIAGNOSIS — Z880 Allergy status to penicillin: Secondary | ICD-10-CM | POA: Insufficient documentation

## 2015-10-06 LAB — COMPREHENSIVE METABOLIC PANEL
ALBUMIN: 3.6 g/dL (ref 3.5–5.0)
ALK PHOS: 84 U/L (ref 38–126)
ALT: 36 U/L (ref 17–63)
AST: 34 U/L (ref 15–41)
Anion gap: 7 (ref 5–15)
BILIRUBIN TOTAL: 0.2 mg/dL — AB (ref 0.3–1.2)
BUN: 12 mg/dL (ref 6–20)
CALCIUM: 9.3 mg/dL (ref 8.9–10.3)
CO2: 26 mmol/L (ref 22–32)
Chloride: 111 mmol/L (ref 101–111)
Creatinine, Ser: 0.81 mg/dL (ref 0.61–1.24)
GFR calc Af Amer: >60 mL/min (ref >60)
GFR calc non Af Amer: >60 mL/min (ref >60)
GLUCOSE: 98 mg/dL (ref 65–99)
Potassium: 3.7 mmol/L (ref 3.5–5.1)
Sodium: 144 mmol/L (ref 135–145)
TOTAL PROTEIN: 6 g/dL — AB (ref 6.5–8.1)

## 2015-10-06 LAB — URINALYSIS COMPLETE WITH MICROSCOPIC (ARMC ONLY)
BACTERIA UA: NONE SEEN
Bilirubin Urine: NEGATIVE
Glucose, UA: NEGATIVE mg/dL
Hgb urine dipstick: NEGATIVE
KETONES UR: NEGATIVE mg/dL
LEUKOCYTES UA: NEGATIVE
NITRITE: NEGATIVE
PH: 5 (ref 5.0–8.0)
PROTEIN: NEGATIVE mg/dL
SPECIFIC GRAVITY, URINE: 1.017 (ref 1.005–1.030)

## 2015-10-06 LAB — LIPASE, BLOOD: Lipase: 40 U/L (ref 11–51)

## 2015-10-06 LAB — CBC WITH DIFFERENTIAL/PLATELET
BASOS PCT: 1 %
Basophils Absolute: 0.1 10*3/uL (ref 0–0.1)
Eosinophils Absolute: 0.5 10*3/uL (ref 0–0.7)
Eosinophils Relative: 6 %
HEMATOCRIT: 38 % — AB (ref 40.0–52.0)
HEMOGLOBIN: 12.9 g/dL — AB (ref 13.0–18.0)
LYMPHS ABS: 3.2 10*3/uL (ref 1.0–3.6)
Lymphocytes Relative: 33 %
MCH: 32.6 pg (ref 26.0–34.0)
MCHC: 33.8 g/dL (ref 32.0–36.0)
MCV: 96.5 fL (ref 80.0–100.0)
MONOS PCT: 10 %
Monocytes Absolute: 1 10*3/uL (ref 0.2–1.0)
NEUTROS ABS: 4.8 10*3/uL (ref 1.4–6.5)
NEUTROS PCT: 50 %
Platelets: 238 10*3/uL (ref 150–440)
RBC: 3.94 MIL/uL — ABNORMAL LOW (ref 4.40–5.90)
RDW: 13.2 % (ref 11.5–14.5)
WBC: 9.5 10*3/uL (ref 3.8–10.6)

## 2015-10-06 MED ORDER — HYDROMORPHONE HCL 1 MG/ML IJ SOLN
INTRAMUSCULAR | Status: AC
  Administered 2015-10-06: 1 mg
  Filled 2015-10-06: qty 1

## 2015-10-06 MED ORDER — ONDANSETRON HCL 4 MG/2ML IJ SOLN
INTRAMUSCULAR | Status: AC
  Administered 2015-10-06: 4 mg
  Filled 2015-10-06: qty 2

## 2015-10-06 NOTE — ED Provider Notes (Signed)
[facility] Emergency Department Provider Note  ____________________________________________  Time seen: 6:00 AM  I have reviewed the triage vital signs and the nursing notes.   HISTORY  Chief Complaint Pancreatitis      HPI Randy Abbott is a 39 y.o. male presents with 10 out of 10 left upper and lower quadrant abdominal pain with onset tonight. Of note patient has been seen on 3 previous emergency Department visits  this month similar complaint and diagnosed with pancreatitis. Patient denies any alcohol use "since his last visit". Patient also admits to vomiting    Past Medical History  Diagnosis Date  . Kidney stones   . Asthma   . Marijuana abuse   . Tobacco abuse   . Cocaine abuse in remission   . Alcohol abuse     Patient Active Problem List   Diagnosis Date Noted  . Acute pancreatitis 09/29/2015    Past Surgical History  Procedure Laterality Date  . Appendectomy      Current Outpatient Rx  Name  Route  Sig  Dispense  Refill  . cyclobenzaprine (FLEXERIL) 10 MG tablet   Oral   Take 1 tablet (10 mg total) by mouth every 8 (eight) hours as needed for muscle spasms.   30 tablet   1   . folic acid (FOLVITE) 1 MG tablet   Oral   Take 1 tablet (1 mg total) by mouth daily.   30 tablet   0   . LORazepam (ATIVAN) 1 MG tablet   Oral   Take 1 tablet (1 mg total) by mouth 2 (two) times daily as needed for anxiety (CIWA-AR > 8-OR-withdrawal symptoms:anxiety, agitation, insomnia, diaphoresis, nausea, vomiting, tremors, tachycardia, or hypertension.).   20 tablet   0   . Multiple Vitamin (MULTIVITAMIN WITH MINERALS) TABS tablet   Oral   Take 1 tablet by mouth daily.   30 tablet   0   . ondansetron (ZOFRAN) 4 MG tablet   Oral   Take 1 tablet (4 mg total) by mouth every 8 (eight) hours as needed for nausea or vomiting.   15 tablet   0   . oxyCODONE-acetaminophen (ROXICET) 5-325 MG tablet   Oral   Take 1 tablet by mouth  every 4 (four) hours as needed for severe pain.   15 tablet   0   . promethazine (PHENERGAN) 25 MG tablet   Oral   Take 1 tablet (25 mg total) by mouth every 6 (six) hours as needed for nausea or vomiting.   20 tablet   0   . traMADol (ULTRAM) 50 MG tablet   Oral   Take 1 tablet (50 mg total) by mouth every 6 (six) hours as needed.   9 tablet   0     Allergies Penicillins; Toradol; and Tetracyclines & related  Family History  Problem Relation Age of Onset  . Lung cancer Sister   . Lung cancer Other   . Colon cancer Mother   . Leukemia Other     Social History Social History  Substance Use Topics  . Smoking status: Current Every Day Smoker -- 3.00 packs/day    Types: Cigarettes  . Smokeless tobacco: None  . Alcohol Use: 0.0 oz/week    0 Standard drinks or equivalent per week     Comment: 2-3 40 OZ beers nightly.    Review of Systems  Constitutional: Negative for fever. Eyes: Negative for visual changes. ENT: Negative for sore throat. Cardiovascular: Negative  for chest pain. Respiratory: Negative for shortness of breath. Gastrointestinal: Positive for abdominal pain and vomiting Genitourinary: Negative for dysuria. Musculoskeletal: Negative for back pain. Skin: Negative for rash. Neurological: Negative for headaches, focal weakness or numbness.   10-point ROS otherwise negative.  ____________________________________________   PHYSICAL EXAM:  VITAL SIGNS: ED Triage Vitals  Enc Vitals Group     BP --      Pulse --      Resp --      Temp --      Temp src --      SpO2 10/06/15 0511 97 %     Weight 10/06/15 0512 140 lb (63.504 kg)     Height 10/06/15 0512 5\' 9"  (1.753 m)     Head Cir --      Peak Flow --      Pain Score 10/06/15 0618 8     Pain Loc --      Pain Edu? --      Excl. in GC? --      Constitutional: Alert and oriented. Apparent distress Eyes: Conjunctivae are normal. PERRL. Normal extraocular movements. ENT   Head:  Normocephalic and atraumatic.   Nose: No congestion/rhinnorhea.   Mouth/Throat: Mucous membranes are moist.   Neck: No stridor. Hematological/Lymphatic/Immunilogical: No cervical lymphadenopathy. Cardiovascular: Normal rate, regular rhythm. Normal and symmetric distal pulses are present in all extremities. No murmurs, rubs, or gallops. Respiratory: Normal respiratory effort without tachypnea nor retractions. Breath sounds are clear and equal bilaterally. No wheezes/rales/rhonchi. Gastrointestinal: Soft and nontender. No distention. There is no CVA tenderness. Genitourinary: deferred Musculoskeletal: Nontender with normal range of motion in all extremities. No joint effusions.  No lower extremity tenderness nor edema. Neurologic:  Normal speech and language. No gross focal neurologic deficits are appreciated. Speech is normal.  Skin:  Skin is warm, dry and intact. No rash noted. Psychiatric: Mood and affect are normal. Speech and behavior are normal. Patient exhibits appropriate insight and judgment.  ____________________________________________    LABS (pertinent positives/negatives)  Labs Reviewed  CBC WITH DIFFERENTIAL/PLATELET - Abnormal; Notable for the following:    RBC 3.94 (*)    Hemoglobin 12.9 (*)    HCT 38.0 (*)    All other components within normal limits  COMPREHENSIVE METABOLIC PANEL - Abnormal; Notable for the following:    Total Protein 6.0 (*)    Total Bilirubin 0.2 (*)    All other components within normal limits  LIPASE, BLOOD  URINALYSIS COMPLETEWITH MICROSCOPIC ([facility] ONLY)     ____________________________________________   EKG  ED ECG REPORT I, BROWN, [place] N, the attending physician, personally viewed and interpreted this ECG.   Date: 10/06/2015  EKG Time: 5:16 AM  Rate: 102  Rhythm: Sinus tachycardia  Axis: None  Intervals: Normal  ST&T Change: None        INITIAL IMPRESSION / ASSESSMENT AND PLAN / ED COURSE  Pertinent labs &  imaging results that were available during my care of the patient were reviewed by me and considered in my medical decision making (see chart for details).  ____________________________________________   FINAL CLINICAL IMPRESSION(S) / ED DIAGNOSES  Final diagnoses:  Chronic abdominal pain      Darci Currentandolph N Brown, MD 10/08/15 [phone]

## 2015-10-06 NOTE — Discharge Instructions (Signed)

## 2015-10-06 NOTE — ED Notes (Signed)
Patient returns to the ED via EMS from home after spending the weekend here for diagnosis of pancreatitis. Patient states he was afraid of losing his job so he left AMA and within 24 hours returned due to pain. Was admitted for a couple of days. Returns tonight because the pain is returning and he can't "take it anymore." Abdomen is firm, tenderness with guarding over the epigastric area. Patient states nausea without vomiting.

## 2015-10-23 ENCOUNTER — Emergency Department
Admission: EM | Admit: 2015-10-23 | Discharge: 2015-10-24 | Disposition: A | Discharge status: Home / Self Care | Payer: Medicaid Other | Attending: Emergency Medicine | Admitting: Emergency Medicine

## 2015-10-23 ENCOUNTER — Emergency Department: Payer: Medicaid Other

## 2015-10-23 ENCOUNTER — Encounter: Payer: Self-pay | Admitting: *Deleted

## 2015-10-23 DIAGNOSIS — Z880 Allergy status to penicillin: Secondary | ICD-10-CM | POA: Insufficient documentation

## 2015-10-23 DIAGNOSIS — Z79899 Other long term (current) drug therapy: Secondary | ICD-10-CM | POA: Insufficient documentation

## 2015-10-23 DIAGNOSIS — R1013 Epigastric pain: Secondary | ICD-10-CM | POA: Diagnosis not present

## 2015-10-23 DIAGNOSIS — R1011 Right upper quadrant pain: Secondary | ICD-10-CM | POA: Diagnosis present

## 2015-10-23 DIAGNOSIS — R1010 Upper abdominal pain, unspecified: Secondary | ICD-10-CM

## 2015-10-23 DIAGNOSIS — F17210 Nicotine dependence, cigarettes, uncomplicated: Secondary | ICD-10-CM | POA: Diagnosis not present

## 2015-10-23 HISTORY — DX: Acute pancreatitis without necrosis or infection, unspecified: K85.90

## 2015-10-23 LAB — URINALYSIS COMPLETE WITH MICROSCOPIC (ARMC ONLY)
Bacteria, UA: NONE SEEN
Bilirubin Urine: NEGATIVE
Glucose, UA: NEGATIVE mg/dL
Hgb urine dipstick: NEGATIVE
LEUKOCYTES UA: NEGATIVE
NITRITE: NEGATIVE
PH: 6 (ref 5.0–8.0)
PROTEIN: NEGATIVE mg/dL
SPECIFIC GRAVITY, URINE: 1.012 (ref 1.005–1.030)

## 2015-10-23 LAB — LIPASE, BLOOD: Lipase: 25 U/L (ref 11–51)

## 2015-10-23 LAB — COMPREHENSIVE METABOLIC PANEL
ALT: 28 U/L (ref 17–63)
ANION GAP: 10 (ref 5–15)
AST: 33 U/L (ref 15–41)
Albumin: 4.3 g/dL (ref 3.5–5.0)
Alkaline Phosphatase: 68 U/L (ref 38–126)
BUN: 13 mg/dL (ref 6–20)
CHLORIDE: 105 mmol/L (ref 101–111)
CO2: 23 mmol/L (ref 22–32)
Calcium: 9.1 mg/dL (ref 8.9–10.3)
Creatinine, Ser: 0.89 mg/dL (ref 0.61–1.24)
GFR calc non Af Amer: >60 mL/min (ref >60)
Glucose, Bld: 87 mg/dL (ref 65–99)
Potassium: 3.7 mmol/L (ref 3.5–5.1)
SODIUM: 138 mmol/L (ref 135–145)
Total Bilirubin: 1.1 mg/dL (ref 0.3–1.2)
Total Protein: 6.5 g/dL (ref 6.5–8.1)

## 2015-10-23 LAB — CBC WITH DIFFERENTIAL/PLATELET
Basophils Absolute: 0.1 10*3/uL (ref 0–0.1)
Basophils Relative: 1 %
EOS ABS: 0.6 10*3/uL (ref 0–0.7)
EOS PCT: 6 %
HCT: 38 % — ABNORMAL LOW (ref 40.0–52.0)
Hemoglobin: 12.9 g/dL — ABNORMAL LOW (ref 13.0–18.0)
LYMPHS ABS: 3.1 10*3/uL (ref 1.0–3.6)
Lymphocytes Relative: 26 %
MCH: 32.1 pg (ref 26.0–34.0)
MCHC: 33.9 g/dL (ref 32.0–36.0)
MCV: 94.5 fL (ref 80.0–100.0)
MONOS PCT: 9 %
Monocytes Absolute: 1.1 10*3/uL — ABNORMAL HIGH (ref 0.2–1.0)
Neutro Abs: 6.9 10*3/uL — ABNORMAL HIGH (ref 1.4–6.5)
Neutrophils Relative %: 58 %
PLATELETS: 256 10*3/uL (ref 150–440)
RBC: 4.02 MIL/uL — AB (ref 4.40–5.90)
RDW: 13.1 % (ref 11.5–14.5)
WBC: 11.8 10*3/uL — AB (ref 3.8–10.6)

## 2015-10-23 MED ORDER — SODIUM CHLORIDE 0.9 % IV BOLUS (SEPSIS)
1000.0000 mL | Freq: Once | INTRAVENOUS | Status: AC
  Administered 2015-10-24: 1000 mL via INTRAVENOUS

## 2015-10-23 MED ORDER — MORPHINE SULFATE (PF) 4 MG/ML IV SOLN
4.0000 mg | Freq: Once | INTRAVENOUS | Status: AC
  Administered 2015-10-24: 4 mg via INTRAVENOUS
  Filled 2015-10-23: qty 1

## 2015-10-23 MED ORDER — ONDANSETRON HCL 4 MG/2ML IJ SOLN
4.0000 mg | Freq: Once | INTRAMUSCULAR | Status: AC
  Administered 2015-10-23: 4 mg via INTRAVENOUS
  Filled 2015-10-23: qty 2

## 2015-10-23 MED ORDER — IOHEXOL 240 MG/ML SOLN
25.0000 mL | Freq: Once | INTRAMUSCULAR | Status: AC | PRN
  Administered 2015-10-23: 25 mL via ORAL

## 2015-10-23 MED ORDER — SODIUM CHLORIDE 0.9 % IV SOLN
1000.0000 mL | Freq: Once | INTRAVENOUS | Status: AC
  Administered 2015-10-23: 1000 mL via INTRAVENOUS

## 2015-10-23 MED ORDER — IOHEXOL 300 MG/ML  SOLN
100.0000 mL | Freq: Once | INTRAMUSCULAR | Status: AC | PRN
  Administered 2015-10-23: 100 mL via INTRAVENOUS

## 2015-10-23 MED ORDER — MORPHINE SULFATE (PF) 4 MG/ML IV SOLN
4.0000 mg | Freq: Once | INTRAVENOUS | Status: AC
  Administered 2015-10-23: 4 mg via INTRAVENOUS
  Filled 2015-10-23: qty 1

## 2015-10-23 MED ORDER — ONDANSETRON HCL 4 MG/2ML IJ SOLN
4.0000 mg | Freq: Once | INTRAMUSCULAR | Status: AC
  Administered 2015-10-24: 4 mg via INTRAVENOUS
  Filled 2015-10-23: qty 2

## 2015-10-23 NOTE — ED Notes (Signed)
Patient had a large, loose stool, which did not relieve his abdominal pain.

## 2015-10-23 NOTE — ED Notes (Signed)
Per EMS report, patient c/o RUQ-mid abdominal pain for approximately 1.5 hours PTA. Patient denies nausea, states loose stool x1 today. Patient has hx of pancreatitis as of 09/26/2015 and hasn't had an alcoholic drink since per patient's report.

## 2015-10-23 NOTE — ED Provider Notes (Signed)
[facility] Emergency Department Provider Note REMINDER - THIS NOTE IS NOT A FINAL MEDICAL RECORD UNTIL IT IS SIGNED. UNTIL THEN, THE CONTENT BELOW MAY REFLECT INFORMATION FROM A DOCUMENTATION TEMPLATE, NOT THE ACTUAL PATIENT VISIT. ____________________________________________  Time seen: Approximately 10:05 PM  I have reviewed the triage vital signs and the nursing notes.   HISTORY  Chief Complaint Abdominal Pain    HPI Randy Abbott is a 40 y.o. male status for evaluation of sudden onset of severe right upper abdominal pain about 2 hours ago. He reports that he has similar pain and was admitted for "pancreatitis" but today the pain does seem more on the right side. This is associated with severe nausea but no vomiting. He did have one loose nonbloody stool today.  Denies chest pain or trouble breathing. He reports severe pain sharp and stabbing in the right upper abdomen. He did have a history of alcohol abuse in the past, but states that he has been sober since early December because of recommendations from his doctor to stop it is pancreatitis.  No fevers or chills. Does also report a history of a kidney stone in the past.  Past Medical History  Diagnosis Date  . Kidney stones   . Asthma   . Marijuana abuse   . Tobacco abuse   . Cocaine abuse in remission   . Alcohol abuse   . Pancreatitis     Patient Active Problem List   Diagnosis Date Noted  . Acute pancreatitis 09/29/2015    Past Surgical History  Procedure Laterality Date  . Appendectomy      Current Outpatient Rx  Name  Route  Sig  Dispense  Refill  . cyclobenzaprine (FLEXERIL) 10 MG tablet   Oral   Take 1 tablet (10 mg total) by mouth every 8 (eight) hours as needed for muscle spasms.   30 tablet   1   . folic acid (FOLVITE) 1 MG tablet   Oral   Take 1 tablet (1 mg total) by mouth daily.   30 tablet   0   . LORazepam (ATIVAN) 1 MG tablet   Oral   Take 1 tablet (1 mg  total) by mouth 2 (two) times daily as needed for anxiety (CIWA-AR > 8-OR-withdrawal symptoms:anxiety, agitation, insomnia, diaphoresis, nausea, vomiting, tremors, tachycardia, or hypertension.).   20 tablet   0   . Multiple Vitamin (MULTIVITAMIN WITH MINERALS) TABS tablet   Oral   Take 1 tablet by mouth daily.   30 tablet   0   . ondansetron (ZOFRAN ODT) 4 MG disintegrating tablet   Oral   Take 1 tablet (4 mg total) by mouth every 6 (six) hours as needed for nausea or vomiting.   20 tablet   0   . oxyCODONE-acetaminophen (ROXICET) 5-325 MG tablet   Oral   Take 1 tablet by mouth every 4 (four) hours as needed for severe pain.   15 tablet   0   . promethazine (PHENERGAN) 25 MG tablet   Oral   Take 1 tablet (25 mg total) by mouth every 6 (six) hours as needed for nausea or vomiting.   20 tablet   0   . traMADol (ULTRAM) 50 MG tablet   Oral   Take 1 tablet (50 mg total) by mouth every 6 (six) hours as needed.   9 tablet   0     Allergies Penicillins; Toradol; and Tetracyclines & related  Family History  Problem Relation  Age of Onset  . Lung cancer Sister   . Lung cancer Other   . Colon cancer Mother   . Leukemia Other     Social History Social History  Substance Use Topics  . Smoking status: Current Every Day Smoker -- 3.00 packs/day    Types: Cigarettes  . Smokeless tobacco: None  . Alcohol Use: 0.0 oz/week    0 Standard drinks or equivalent per week     Comment: Last drink was 09/26/2015    Review of Systems Constitutional: No fever/chills Eyes: No visual changes. ENT: No sore throat. Cardiovascular: Denies chest pain. Respiratory: Denies shortness of breath. Gastrointestinal: No vomiting.  No constipation. Genitourinary: Negative for dysuria. No pain in the groin or testicles. Musculoskeletal: Negative for back pain. Skin: Negative for rash. Neurological: Negative for headaches, focal weakness or numbness.  10-point ROS otherwise  negative.  ____________________________________________   PHYSICAL EXAM:  VITAL SIGNS: ED Triage Vitals  Enc Vitals Group     BP 10/23/15 2133 111/82 mmHg     Pulse Rate 10/23/15 2133 91     Resp 10/23/15 2133 20     Temp 10/23/15 2133 98.1 F (36.7 C)     Temp Source 10/23/15 2133 Oral     SpO2 10/23/15 2129 98 %     Weight 10/23/15 2133 145 lb (65.772 kg)     Height 10/23/15 2133 5\' 9"  (1.753 m)     Head Cir --      Peak Flow --      Pain Score 10/23/15 2137 10     Pain Loc --      Pain Edu? --      Excl. in GC? --    Constitutional: Alert and oriented. Vision moderate pain, holding his hand over his right upper abdomen. Eyes: Conjunctivae are normal. PERRL. EOMI. Head: Atraumatic. Nose: No congestion/rhinnorhea. Mouth/Throat: Mucous membranes are moist.  Oropharynx non-erythematous. Neck: No stridor.   Cardiovascular: Normal rate, regular rhythm. Grossly normal heart sounds.  Good peripheral circulation. Respiratory: Normal respiratory effort.  No retractions. Lungs CTAB. Gastrointestinal: Soft and nontender in the lower abdomen, there is moderate tenderness in the epigastrium and right upper quadrant with a negative Murphy and no evidence of peritonitis . No distention. No abdominal bruits. No CVA tenderness. Testicles nontender. No pain or swelling in the groin. No rebound or guarding. Musculoskeletal: No lower extremity tenderness nor edema.  No joint effusions. Neurologic:  Normal speech and language. No gross focal neurologic deficits are appreciated.  Skin:  Skin is warm, dry and intact. No rash noted. Psychiatric: Mood and affect are normal. Speech and behavior are normal.  ____________________________________________   LABS (all labs ordered are listed, but only abnormal results are displayed)  Labs Reviewed  CBC WITH DIFFERENTIAL/PLATELET - Abnormal; Notable for the following:    WBC 11.8 (*)    RBC 4.02 (*)    Hemoglobin 12.9 (*)    HCT 38.0 (*)     Neutro Abs 6.9 (*)    Monocytes Absolute 1.1 (*)    All other components within normal limits  URINALYSIS COMPLETEWITH MICROSCOPIC ([facility] ONLY) - Abnormal; Notable for the following:    Color, Urine YELLOW (*)    APPearance CLEAR (*)    Ketones, ur TRACE (*)    Squamous Epithelial / LPF 0-5 (*)    All other components within normal limits  COMPREHENSIVE METABOLIC PANEL  LIPASE, BLOOD   ____________________________________________  EKG  Reviewed and interpreted by me at 2145 Normal  sinus rhythm, mild early repolarization without ischemic abnormality Heart rate 70 PR 140 QTc 390 reviewed and interpreted as early repolarization without acute ischemia ____________________________________________  RADIOLOGY  CT Abdomen Pelvis W Contrast (Final result) Result time: 10/23/15 23:34:40   Final result by Rad Results In Interface (10/23/15 23:34:40)   Narrative:   CLINICAL DATA: Per EMS report, patient c/o RUQ-mid abdominal pain for approximately 1.5 hours PTA. Patient denies nausea, states loose stool x1 today. Patient has hx of pancreatitis as of 09/26/2015 and hasn't had an alcoholic drink since per patient's report.  EXAM: CT ABDOMEN AND PELVIS WITH CONTRAST  TECHNIQUE: Multidetector CT imaging of the abdomen and pelvis was performed using the standard protocol following bolus administration of intravenous contrast.  CONTRAST: OMNIPAQUE IOHEXOL 300 MG/ML SOLN  COMPARISON: 09/28/2015  FINDINGS: Lung bases: Minimal dependent subsegmental atelectasis. Is clear. Heart normal in size.  Hepatobiliary: No liver mass or focal lesion. There is stable, mild, intrahepatic bile duct dilation. Common bile duct measures 6 mm in diameter, mildly dilated into the pancreatic head. This is stable.  Pancreas: Dilation of pancreatic duct is seen measuring 3 mm. This is also stable. No pancreatic masses. No pancreatic inflammation. There is homogeneous pancreatic  enhancement.  Spleen: Unremarkable.  Adrenal glands: No masses.  Kidneys, ureters, bladder: Stable 6 mm stone in the midpole the left kidney. No other intrarenal stones. No renal masses no hydronephrosis. Ureters and bladder are unremarkable.  Lymph nodes: No adenopathy.  Ascites: None.  Gastrointestinal: Surgical vascular clips noted along the cecal tip consistent with a prior appendectomy. Stomach, colon and small bowel are unremarkable.  Musculoskeletal: Unremarkable.  IMPRESSION: 1. No acute findings in the abdomen or pelvis. 2. No evidence of pancreatitis. There is mild chronic dilation of the intra and extrahepatic bile ducts and the pancreatic duct, unchanged from the prior CT. 3. 6 mm left intrarenal stone, also stable the prior CT.   Electronically Signed By: Amie Portland M.D. On: 10/23/2015 23:34    ____________________________________________   PROCEDURES  Procedure(s) performed: None  Critical Care performed: No  ____________________________________________   INITIAL IMPRESSION / ASSESSMENT AND PLAN / ED COURSE  Pertinent labs & imaging results that were available during my care of the patient were reviewed by me and considered in my medical decision making (see chart for details).  Patient was of upper abdominal pain and nausea. Labs are reassuring with some minimal leukocytosis without fever. He does have tenderness focally in the upper abdomen without rebound or guarding. No bloody emesis. Overall he appears reassuring in no distress, does appear in pain.  Labs reassuring, CT does not demonstrate acute abnormality. At 11:50 PM he reports his pain is slowly improving and is comfortable currently sitting up conversant with friend in the room. We will give additional morphine at this time, and by mouth challenge. Should he pass this and continue to feel well at discharge the patient with diagnosis of abdominal pain. No evidence for acute  emergent cause. No cardiopulmonary symptoms.  Ongoing care and disposition assigned to Dr. Zenda Alpers at midnight. ____________________________________________   FINAL CLINICAL IMPRESSION(S) / ED DIAGNOSES  Final diagnoses:  Upper abdominal pain      Sharyn Creamer, MD 10/24/15 0003

## 2015-10-24 MED ORDER — ONDANSETRON 4 MG PO TBDP: 4.0000 mg | ORAL_TABLET | Freq: Four times a day (QID) | ORAL | Status: AC | PRN

## 2015-10-24 NOTE — ED Provider Notes (Signed)
-----------------------------------------   1:25 AM on 10/24/2015 -----------------------------------------   Blood pressure 121/86, pulse 65, temperature 98.1 F (36.7 C), temperature source Oral, resp. rate 20, height 5\' 9"  (1.753 m), weight 145 lb (65.772 kg), SpO2 100 %.  Assuming care from Dr. Fanny BienQuale.  In short, Georga KaufmannRoy M Lomas III is a 40 y.o. male with a chief complaint of Abdominal Pain .  Refer to the original H&P for additional details.  The current plan of care is to follow up the patient after pain medication to see if his pain symptoms have improved. The patient's pain improved and he also was able to eat without worse pain. He will be discharged to home. Rebecka Apley.   Allison P Webster, MD 10/24/15 [phone]

## 2015-10-24 NOTE — Discharge Instructions (Signed)
You were seen in the emergency room for abdominal pain. It is important that you follow up closely with your primary care doctor in the next couple of days. ° °If you're unable to see her primary care doctor you may return to the emergency room or go to the [facility] walk-in clinic in 1 or 2 days for reexam. ° °Please return to the emergency room right away if you are to develop a fever, severe nausea, your pain becomes severe or worsens, you are unable to keep food down, begin vomiting any dark or bloody fluid, you develop any dark or bloody stools, feel dehydrated, or other new concerns or symptoms arise. ° ° °Abdominal Pain, Adult °Many things can cause abdominal pain. Usually, abdominal pain is not caused by a disease and will improve without treatment. It can often be observed and treated at home. Your health care provider will do a physical exam and possibly order blood tests and X-rays to help determine the seriousness of your pain. However, in many cases, more time must pass before a clear cause of the pain can be found. Before that point, your health care provider may not know if you need more testing or further treatment. °HOME CARE INSTRUCTIONS °Monitor your abdominal pain for any changes. The following actions may help to alleviate any discomfort you are experiencing: °· Only take over-the-counter or prescription medicines as directed by your health care provider. °· Do not take laxatives unless directed to do so by your health care provider. °· Try a clear liquid diet (broth, tea, or water) as directed by your health care provider. Slowly move to a bland diet as tolerated. °SEEK MEDICAL CARE IF: °· You have unexplained abdominal pain. °· You have abdominal pain associated with nausea or diarrhea. °· You have pain when you urinate or have a bowel movement. °· You experience abdominal pain that wakes you in the night. °· You have abdominal pain that is worsened or improved by eating food. °· You have  abdominal pain that is worsened with eating fatty foods. °· You have a fever. °SEEK IMMEDIATE MEDICAL CARE IF: °· Your pain does not go away within 2 hours. °· You keep throwing up (vomiting). °· Your pain is felt only in portions of the abdomen, such as the right side or the left lower portion of the abdomen. °· You pass bloody or black tarry stools. °MAKE SURE YOU: °· Understand these instructions. °· Will watch your condition. °· Will get help right away if you are not doing well or get worse. °  °This information is not intended to replace advice given to you by your health care provider. Make sure you discuss any questions you have with your health care provider. °  °Document Released: 07/13/2005 Document Revised: 06/24/2015 Document Reviewed: 06/12/2013 °Elsevier Interactive Patient Education ©2016 [employer] ° °

## 2015-11-24 ENCOUNTER — Ambulatory Visit: Payer: Self-pay | Admitting: Urology

## 2016-10-13 ENCOUNTER — Emergency Department: Payer: Medicaid Other

## 2016-10-13 ENCOUNTER — Observation Stay
Admission: EM | Admit: 2016-10-13 | Discharge: 2016-10-14 | Disposition: A | Discharge status: Home / Self Care | Payer: Medicaid Other | Attending: Internal Medicine | Admitting: Internal Medicine

## 2016-10-13 ENCOUNTER — Encounter: Payer: Self-pay | Admitting: Emergency Medicine

## 2016-10-13 DIAGNOSIS — Z8249 Family history of ischemic heart disease and other diseases of the circulatory system: Secondary | ICD-10-CM | POA: Insufficient documentation

## 2016-10-13 DIAGNOSIS — I081 Rheumatic disorders of both mitral and tricuspid valves: Secondary | ICD-10-CM | POA: Diagnosis not present

## 2016-10-13 DIAGNOSIS — K859 Acute pancreatitis without necrosis or infection, unspecified: Secondary | ICD-10-CM | POA: Insufficient documentation

## 2016-10-13 DIAGNOSIS — F430 Acute stress reaction: Secondary | ICD-10-CM | POA: Insufficient documentation

## 2016-10-13 DIAGNOSIS — R079 Chest pain, unspecified: Principal | ICD-10-CM | POA: Insufficient documentation

## 2016-10-13 DIAGNOSIS — Z87442 Personal history of urinary calculi: Secondary | ICD-10-CM | POA: Insufficient documentation

## 2016-10-13 DIAGNOSIS — F1010 Alcohol abuse, uncomplicated: Secondary | ICD-10-CM | POA: Insufficient documentation

## 2016-10-13 DIAGNOSIS — F1411 Cocaine abuse, in remission: Secondary | ICD-10-CM | POA: Insufficient documentation

## 2016-10-13 DIAGNOSIS — F17210 Nicotine dependence, cigarettes, uncomplicated: Secondary | ICD-10-CM | POA: Diagnosis not present

## 2016-10-13 DIAGNOSIS — R000 Tachycardia, unspecified: Secondary | ICD-10-CM | POA: Insufficient documentation

## 2016-10-13 DIAGNOSIS — J45909 Unspecified asthma, uncomplicated: Secondary | ICD-10-CM | POA: Insufficient documentation

## 2016-10-13 DIAGNOSIS — R001 Bradycardia, unspecified: Secondary | ICD-10-CM | POA: Diagnosis present

## 2016-10-13 DIAGNOSIS — F1210 Cannabis abuse, uncomplicated: Secondary | ICD-10-CM | POA: Diagnosis not present

## 2016-10-13 DIAGNOSIS — Z885 Allergy status to narcotic agent status: Secondary | ICD-10-CM | POA: Diagnosis not present

## 2016-10-13 DIAGNOSIS — F419 Anxiety disorder, unspecified: Secondary | ICD-10-CM | POA: Insufficient documentation

## 2016-10-13 DIAGNOSIS — Z880 Allergy status to penicillin: Secondary | ICD-10-CM | POA: Diagnosis not present

## 2016-10-13 DIAGNOSIS — F17200 Nicotine dependence, unspecified, uncomplicated: Secondary | ICD-10-CM

## 2016-10-13 DIAGNOSIS — F1910 Other psychoactive substance abuse, uncomplicated: Secondary | ICD-10-CM | POA: Diagnosis present

## 2016-10-13 DIAGNOSIS — I499 Cardiac arrhythmia, unspecified: Secondary | ICD-10-CM | POA: Diagnosis present

## 2016-10-13 DIAGNOSIS — Z881 Allergy status to other antibiotic agents status: Secondary | ICD-10-CM | POA: Diagnosis not present

## 2016-10-13 HISTORY — DX: Family history of ischemic heart disease and other diseases of the circulatory system: Z82.49

## 2016-10-13 LAB — CBC
HCT: 38.5 % — ABNORMAL LOW (ref 40.0–52.0)
HEMOGLOBIN: 13.4 g/dL (ref 13.0–18.0)
MCH: 31.9 pg (ref 26.0–34.0)
MCHC: 34.8 g/dL (ref 32.0–36.0)
MCV: 91.6 fL (ref 80.0–100.0)
Platelets: 253 10*3/uL (ref 150–440)
RBC: 4.2 MIL/uL — AB (ref 4.40–5.90)
RDW: 13.6 % (ref 11.5–14.5)
WBC: 9.5 10*3/uL (ref 3.8–10.6)

## 2016-10-13 LAB — URINE DRUG SCREEN, QUALITATIVE (ARMC ONLY)
Amphetamines, Ur Screen: NOT DETECTED
Barbiturates, Ur Screen: NOT DETECTED
Benzodiazepine, Ur Scrn: NOT DETECTED
Cannabinoid 50 Ng, Ur ~~LOC~~: POSITIVE — AB
Cocaine Metabolite,Ur ~~LOC~~: NOT DETECTED
MDMA (ECSTASY) UR SCREEN: NOT DETECTED
Methadone Scn, Ur: NOT DETECTED
Opiate, Ur Screen: NOT DETECTED
Phencyclidine (PCP) Ur S: NOT DETECTED
TRICYCLIC, UR SCREEN: NOT DETECTED

## 2016-10-13 LAB — BASIC METABOLIC PANEL
ANION GAP: 4 — AB (ref 5–15)
BUN: 11 mg/dL (ref 6–20)
CALCIUM: 8.7 mg/dL — AB (ref 8.9–10.3)
CO2: 27 mmol/L (ref 22–32)
Chloride: 109 mmol/L (ref 101–111)
Creatinine, Ser: 0.72 mg/dL (ref 0.61–1.24)
GLUCOSE: 99 mg/dL (ref 65–99)
Potassium: 4.1 mmol/L (ref 3.5–5.1)
Sodium: 140 mmol/L (ref 135–145)

## 2016-10-13 LAB — TSH: TSH: 0.559 u[IU]/mL (ref 0.350–4.500)

## 2016-10-13 LAB — TROPONIN I: Troponin I: <0.03 ng/mL (ref <0.03)

## 2016-10-13 LAB — ETHANOL

## 2016-10-13 MED ORDER — ACETAMINOPHEN 325 MG PO TABS: 650.0000 mg | ORAL_TABLET | ORAL | Status: DC | PRN

## 2016-10-13 MED ORDER — MORPHINE SULFATE (PF) 2 MG/ML IV SOLN
2.0000 mg | INTRAVENOUS | Status: DC | PRN
  Administered 2016-10-13 (×2): 2 mg via INTRAVENOUS
  Filled 2016-10-13 (×4): qty 1

## 2016-10-13 MED ORDER — MORPHINE SULFATE 2 MG/ML IJ SOLN
2.0000 mg | INTRAMUSCULAR | Status: DC | PRN
  Administered 2016-10-13 – 2016-10-14 (×3): 2 mg via INTRAVENOUS
  Filled 2016-10-13 (×6): qty 1

## 2016-10-13 MED ORDER — ONDANSETRON 4 MG PO TBDP
4.0000 mg | ORAL_TABLET | Freq: Four times a day (QID) | ORAL | Status: DC | PRN
  Filled 2016-10-13: qty 1

## 2016-10-13 MED ORDER — NITROGLYCERIN 0.4 MG SL SUBL: 0.4000 mg | SUBLINGUAL_TABLET | SUBLINGUAL | Status: DC | PRN

## 2016-10-13 MED ORDER — OXYCODONE-ACETAMINOPHEN 5-325 MG PO TABS: 1.0000 | ORAL_TABLET | ORAL | Status: DC | PRN

## 2016-10-13 MED ORDER — GI COCKTAIL ~~LOC~~
30.0000 mL | Freq: Four times a day (QID) | ORAL | Status: DC | PRN
  Filled 2016-10-13: qty 30

## 2016-10-13 MED ORDER — NICOTINE 21 MG/24HR TD PT24
21.0000 mg | MEDICATED_PATCH | Freq: Every day | TRANSDERMAL | Status: DC
  Administered 2016-10-13 – 2016-10-14 (×2): 21 mg via TRANSDERMAL
  Filled 2016-10-13 (×2): qty 1

## 2016-10-13 MED ORDER — ASPIRIN 81 MG PO CHEW
81.0000 mg | CHEWABLE_TABLET | Freq: Every day | ORAL | Status: DC
  Administered 2016-10-13 – 2016-10-14 (×2): 81 mg via ORAL
  Filled 2016-10-13 (×2): qty 1

## 2016-10-13 MED ORDER — ENOXAPARIN SODIUM 40 MG/0.4ML ~~LOC~~ SOLN
40.0000 mg | SUBCUTANEOUS | Status: DC
  Administered 2016-10-13: 40 mg via SUBCUTANEOUS
  Filled 2016-10-13: qty 0.4

## 2016-10-13 MED ORDER — ADULT MULTIVITAMIN W/MINERALS CH
1.0000 | ORAL_TABLET | Freq: Every day | ORAL | Status: DC
  Administered 2016-10-13 – 2016-10-14 (×2): 1 via ORAL
  Filled 2016-10-13 (×3): qty 1

## 2016-10-13 MED ORDER — ONDANSETRON HCL 4 MG/2ML IJ SOLN: 4.0000 mg | Freq: Four times a day (QID) | INTRAMUSCULAR | Status: DC | PRN

## 2016-10-13 MED ORDER — PANTOPRAZOLE SODIUM 40 MG PO TBEC
40.0000 mg | DELAYED_RELEASE_TABLET | Freq: Every day | ORAL | Status: DC
  Administered 2016-10-13 – 2016-10-14 (×2): 40 mg via ORAL
  Filled 2016-10-13 (×2): qty 1

## 2016-10-13 NOTE — ED Triage Notes (Signed)
[facility] is from [facility] the [facility] 3 days. Patient did take his dads nitro prior to coming without relief. Patient reports some nausea with the pain as well.

## 2016-10-13 NOTE — ED Notes (Signed)
Pt bradycardiac at 57, will brady down into 40s and then HR increased to ST in 120s.

## 2016-10-13 NOTE — ED Provider Notes (Signed)
[facility] Emergency Department Provider Note  __________________________________   I have reviewed the triage vital signs and the nursing notes.   HISTORY  Chief Complaint Chest Pain   History limited by: Not Limited   HPI Randy Abbott is a 40 y.o. male who presents to the emergency department today because of concerns for chest pain. It started 3 days ago. For the first couple of days it was intermittent. The patient however states that this morning when he woke up it is been constant. Describes it as intermittently being sharp and squeezing. It is located in the center chest. Some migration up into his lower neck however no pain in his arm or his back. He denies any associated shortness of breath. He has not had any fevers. Denies similar pain in the past. States he drank a little bit of alcohol for the first time in a number of weeks 3 days ago. Per EMS the patient was having runs of both bradycardia and tachycardia.   Past Medical History:  Diagnosis Date  . Alcohol abuse   . Asthma   . Cocaine abuse in remission   . Kidney stones   . Marijuana abuse   . Pancreatitis   . Tobacco abuse     Patient Active Problem List   Diagnosis Date Noted  . Acute pancreatitis 09/29/2015    Past Surgical History:  Procedure Laterality Date  . APPENDECTOMY      Prior to Admission medications   Medication Sig Start Date End Date Taking? Authorizing Provider  folic acid (FOLVITE) 1 MG tablet Take 1 tablet (1 mg total) by mouth daily. 09/30/15   Enedina FinnerSona Patel, MD  LORazepam (ATIVAN) 1 MG tablet Take 1 tablet (1 mg total) by mouth 2 (two) times daily as needed for anxiety (CIWA-AR > 8-OR-withdrawal symptoms:anxiety, agitation, insomnia, diaphoresis, nausea, vomiting, tremors, tachycardia, or hypertension.). 09/30/15   Enedina FinnerSona Patel, MD  Multiple Vitamin (MULTIVITAMIN WITH MINERALS) TABS tablet Take 1 tablet by mouth daily. 09/30/15   Enedina FinnerSona Patel, MD  ondansetron  (ZOFRAN ODT) 4 MG disintegrating tablet Take 1 tablet (4 mg total) by mouth every 6 (six) hours as needed for nausea or vomiting. 10/24/15   Sharyn CreamerMark Quale, MD  oxyCODONE-acetaminophen (ROXICET) 5-325 MG tablet Take 1 tablet by mouth every 4 (four) hours as needed for severe pain. 09/28/15   Irean HongJade J Sung, MD  promethazine (PHENERGAN) 25 MG tablet Take 1 tablet (25 mg total) by mouth every 6 (six) hours as needed for nausea or vomiting. 10/01/15   Emily FilbertJonathan E Williams, MD  traMADol (ULTRAM) 50 MG tablet Take 1 tablet (50 mg total) by mouth every 6 (six) hours as needed. 06/08/15   Chinita Pesterari B Triplett, FNP    Allergies Penicillins; Toradol [ketorolac tromethamine]; and Tetracyclines & related  Family History  Problem Relation Age of Onset  . Lung cancer Sister   . Lung cancer Other   . Colon cancer Mother   . Leukemia Other     Social History Social History  Substance Use Topics  . Smoking status: Current Every Day Smoker    Packs/day: 2.00    Types: Cigarettes  . Smokeless tobacco: Never Used  . Alcohol use No     Comment: Last drink was 09/26/2015    Review of Systems  Constitutional: Negative for fever. Cardiovascular: Positive for chest pain. Respiratory: Negative for shortness of breath. Gastrointestinal: Negative for abdominal pain, vomiting and diarrhea. Neurological: Negative for headaches, focal weakness or numbness.  10-point ROS otherwise negative.  ____________________________________________   PHYSICAL EXAM:  VITAL SIGNS: ED Triage Vitals  Enc Vitals Group     BP --      Pulse Rate 10/13/16 1335 73     Resp 10/13/16 1335 12     Temp 10/13/16 1335 98.2 F (36.8 C)     Temp Source 10/13/16 1335 Oral     SpO2 10/13/16 1335 99 %     Weight 10/13/16 1337 140 lb (63.5 kg)     Height 10/13/16 1337 5\' 8"  (1.727 m)     Head Circumference --      Peak Flow --      Pain Score 10/13/16 1337 8   Constitutional: Alert and oriented. Well appearing and in no distress. Eyes:  Conjunctivae are normal. Normal extraocular movements. ENT   Head: Normocephalic and atraumatic.   Nose: No congestion/rhinnorhea.   Mouth/Throat: Mucous membranes are moist.   Neck: No stridor. Hematological/Lymphatic/Immunilogical: No cervical lymphadenopathy. Cardiovascular: Irregular heart rate.  No murmurs, rubs, or gallops.  Respiratory: Normal respiratory effort without tachypnea nor retractions. Breath sounds are clear and equal bilaterally. No wheezes/rales/rhonchi. Gastrointestinal: Soft and non tender. No rebound. No guarding.  Genitourinary: Deferred Musculoskeletal: Normal range of motion in all extremities. No lower extremity edema. Neurologic:  Normal speech and language. No gross focal neurologic deficits are appreciated.  Skin:  Skin is warm, dry and intact. No rash noted. Psychiatric: Mood and affect are normal. Speech and behavior are normal. Patient exhibits appropriate insight and judgment.  ____________________________________________    LABS (pertinent positives/negatives)  Labs Reviewed  BASIC METABOLIC PANEL - Abnormal; Notable for the following:       Result Value   Calcium 8.7 (*)    Anion gap 4 (*)    All other components within normal limits  CBC - Abnormal; Notable for the following:    RBC 4.20 (*)    HCT 38.5 (*)    All other components within normal limits  TROPONIN I  URINE DRUG SCREEN, QUALITATIVE ([facility] ONLY)  ETHANOL     ____________________________________________   EKG  I, Phineas SemenGraydon Omeka Holben, attending physician, personally viewed and interpreted this EKG  EKG Time: 1335 Rate: 67 Rhythm: normal sinus rhythm Axis: normal Intervals: qtc 443 QRS: narrow ST changes: no st elevation Impression: normal ekg   ____________________________________________    RADIOLOGY  CXR   IMPRESSION:  No active disease.       ____________________________________________   PROCEDURES  Procedures  ____________________________________________   INITIAL IMPRESSION / ASSESSMENT AND PLAN / ED COURSE  Pertinent labs & imaging results that were available during my care of the patient were reviewed by me and considered in my medical decision making (see chart for details).  Patient presented to the emergency department today because of concerns for chest pain. Patient was noted to have an irregular rhythm by EMS. His heart rate would alternate between being tachycardic as well as bradycardic. This was seen on monitors here in the emergency department as well. Given bradycardia down into 40 he was placed on pads. Blood work doesn't show any elevation troponin are otherwise obvious etiology of the patient's symptoms. Given the irregular rhythm however I do feel he would benefit from admission.  ____________________________________________   FINAL CLINICAL IMPRESSION(S) / ED DIAGNOSES  Final diagnoses:  Bradycardia  Tachycardia     Note: This dictation was prepared with Dragon dictation. Any transcriptional errors that result from this process are unintentional     Phineas SemenGraydon Auna Mikkelsen,  MD 10/13/16 1504

## 2016-10-13 NOTE — H&P (Addendum)
[facility] [place] [facility]   PATIENT NAME: Randy Abbott    MR#:  [id number]  DATE OF BIRTH:  03/03/1976  DATE OF ADMISSION:  10/13/2016  PRIMARY CARE PHYSICIAN: [facility]   REQUESTING/REFERRING PHYSICIAN: Phineas SemenGraydon Goodman, MD  CHIEF COMPLAINT:   Chief Complaint  Patient presents with  . Chest Pain   Chest pain 3 days. HISTORY OF PRESENT ILLNESS:  Randy Abbott  is a 40 y.o. male with a known history of Pancreatitis, drug abuse, alcohol abuse and asthma. The patient presents to the ED with chest pain on the left side 3 days. The chest pain is on the left side, sharp and squeezing, initially intermittent but a constant today, 8 out of 10, with radiation to left side of neck and left arm, without Exacerbation factors. The patient denies any nausea, vomiting or diaphoresis. He denies any fever, chills, cough or shortness of breath. He denies any recent drug abuse. He was found bradycardia and tachycardia.  PAST MEDICAL HISTORY:   Past Medical History:  Diagnosis Date  . Alcohol abuse   . Asthma   . Cocaine abuse in remission   . Kidney stones   . Marijuana abuse   . Pancreatitis   . Tobacco abuse     PAST SURGICAL HISTORY:   Past Surgical History:  Procedure Laterality Date  . APPENDECTOMY      SOCIAL HISTORY:   Social History  Substance Use Topics  . Smoking status: Current Every Day Smoker    Packs/day: 2.00    Types: Cigarettes  . Smokeless tobacco: Never Used  . Alcohol use No     Comment: Last drink was 09/26/2015    FAMILY HISTORY:   Family History  Problem Relation Age of Onset  . Lung cancer Sister   . Lung cancer Other   . Colon cancer Mother   . Leukemia Other   . Heart disease Maternal Grandmother     DRUG ALLERGIES:   Allergies  Allergen Reactions  . Penicillins Hives  . Toradol [Ketorolac Tromethamine] Hives  . Tetracyclines & Related Rash    REVIEW OF SYSTEMS:   Review of Systems  Constitutional:  Negative for chills, fever and malaise/fatigue.  HENT: Negative for congestion and nosebleeds.   Eyes: Negative for blurred vision and double vision.  Respiratory: Negative for cough, hemoptysis, sputum production, shortness of breath, wheezing and stridor.   Cardiovascular: Positive for chest pain and palpitations. Negative for orthopnea and leg swelling.  Gastrointestinal: Negative for abdominal pain, blood in stool, diarrhea, heartburn, melena, nausea and vomiting.  Genitourinary: Negative for dysuria, hematuria and urgency.  Musculoskeletal: Positive for neck pain. Negative for back pain and joint pain.  Skin: Negative for itching and rash.  Neurological: Negative for dizziness, tremors, focal weakness, loss of consciousness, weakness and headaches.  Psychiatric/Behavioral: Negative for depression. The patient is not nervous/anxious.     MEDICATIONS AT HOME:   Prior to Admission medications   Medication Sig Start Date End Date Taking? Authorizing Provider  folic acid (FOLVITE) 1 MG tablet Take 1 tablet (1 mg total) by mouth daily. 09/30/15   Enedina FinnerSona Patel, MD  LORazepam (ATIVAN) 1 MG tablet Take 1 tablet (1 mg total) by mouth 2 (two) times daily as needed for anxiety (CIWA-AR > 8-OR-withdrawal symptoms:anxiety, agitation, insomnia, diaphoresis, nausea, vomiting, tremors, tachycardia, or hypertension.). 09/30/15   Enedina FinnerSona Patel, MD  Multiple Vitamin (MULTIVITAMIN WITH MINERALS) TABS tablet Take 1 tablet by mouth daily. 09/30/15   Sona  Allena KatzPatel, MD  ondansetron (ZOFRAN ODT) 4 MG disintegrating tablet Take 1 tablet (4 mg total) by mouth every 6 (six) hours as needed for nausea or vomiting. 10/24/15   Sharyn CreamerMark Quale, MD  oxyCODONE-acetaminophen (ROXICET) 5-325 MG tablet Take 1 tablet by mouth every 4 (four) hours as needed for severe pain. 09/28/15   Irean HongJade J Sung, MD  promethazine (PHENERGAN) 25 MG tablet Take 1 tablet (25 mg total) by mouth every 6 (six) hours as needed for nausea or vomiting. 10/01/15    Emily FilbertJonathan E Williams, MD  traMADol (ULTRAM) 50 MG tablet Take 1 tablet (50 mg total) by mouth every 6 (six) hours as needed. 06/08/15   Chinita Pesterari B Triplett, FNP      VITAL SIGNS:  Blood pressure 104/64, pulse (!) 54, temperature 98.2 F (36.8 C), temperature source Oral, resp. rate 10, height 5\' 8"  (1.727 m), weight 140 lb (63.5 kg), SpO2 100 %.  PHYSICAL EXAMINATION:  Physical Exam  GENERAL:  40 y.o.-year-old patient lying in the bed with no acute distress.  EYES: Pupils equal, round, reactive to light and accommodation. No scleral icterus. Extraocular muscles intact.  HEENT: Head atraumatic, normocephalic. Oropharynx and nasopharynx clear.  NECK:  Supple, no jugular venous distention. No thyroid enlargement, no tenderness.  LUNGS: Normal breath sounds bilaterally, no wheezing, rales,rhonchi or crepitation. No use of accessory muscles of respiration. No chest wall tenderness. CARDIOVASCULAR: S1, S2 normal. No murmurs, rubs, or gallops.  ABDOMEN: Soft, nontender, nondistended. Bowel sounds present. No organomegaly or mass.  EXTREMITIES: No pedal edema, cyanosis, or clubbing.  NEUROLOGIC: Cranial nerves II through XII are intact. Muscle strength 5/5 in all extremities. Sensation intact. Gait not checked.  PSYCHIATRIC: The patient is alert and oriented x 3.  SKIN: No obvious rash, lesion, or ulcer.   LABORATORY PANEL:   CBC  Recent Labs Lab 10/13/16 1340  WBC 9.5  HGB 13.4  HCT 38.5*  PLT 253   ------------------------------------------------------------------------------------------------------------------  Chemistries   Recent Labs Lab 10/13/16 1340  NA 140  K 4.1  CL 109  CO2 27  GLUCOSE 99  BUN 11  CREATININE 0.72  CALCIUM 8.7*   ------------------------------------------------------------------------------------------------------------------  Cardiac Enzymes  Recent Labs Lab 10/13/16 1340  TROPONINI <0.03    ------------------------------------------------------------------------------------------------------------------  RADIOLOGY:  Dg Chest Portable 1 View  Result Date: 10/13/2016 CLINICAL DATA:  [facility] is from [facility] the last 3 days. Patient did take his dads nitro prior to coming without relief. Patient reports some nausea with the pain as well; Pt's heart rate keeps dropping into the 40s Asthma, smoker, EXAM: PORTABLE CHEST 1 VIEW COMPARISON:  09/29/2015 FINDINGS: Cardiac silhouette is normal in size and configuration. No mediastinal or hilar masses. No evidence of adenopathy. Stable scarring noted at the lung apices. Lungs are otherwise clear. No pleural effusion. No pneumothorax. Skeletal structures are intact. IMPRESSION: No active disease. Electronically Signed   By: Amie Portlandavid  Ormond M.D.   On: 10/13/2016 14:49      IMPRESSION AND PLAN:   Chest pain with bradycardia and tachycardia, rule out ACS. The patient will be placed for observation. Telemetry monitor. Give aspirin, follow-up troponin level, lipid panel, stress test and cardiology consult.  History of drug abuse. Drug cessation was counseled. Follow-up urine drug screen.  Tobacco abuse. Smoking cessation was counseled for 3 minutes, nicotine patch.   All the records are reviewed and case discussed with ED provider. Management plans discussed with the patient, family and they are in agreement.  CODE STATUS: Full code  TOTAL TIME TAKING CARE OF THIS PATIENT: 52 minutes.    Shaune Pollack M.D on 10/13/2016 at 3:22 PM  Between 7am to 6pm - Pager - [phone]  After 6pm go to [email or web address] - Social research officer, government  [facility] [place] [facility]  Office  [phone]  CC: Primary care physician; [facility]   Note: This dictation was prepared with Dragon dictation along with smaller phrase technology. Any transcriptional errors that result from this process are  unintentional.

## 2016-10-13 NOTE — ED Notes (Addendum)
Pt HR 39, with long pauses. Pt's HR now 57, MD aware and at bedside.

## 2016-10-14 ENCOUNTER — Observation Stay (HOSPITAL_BASED_OUTPATIENT_CLINIC_OR_DEPARTMENT_OTHER)
Admit: 2016-10-14 | Discharge: 2016-10-14 | Disposition: A | Discharge status: Home / Self Care | Payer: Medicaid Other | Attending: Physician Assistant | Admitting: Physician Assistant

## 2016-10-14 ENCOUNTER — Encounter: Payer: Self-pay | Admitting: Physician Assistant

## 2016-10-14 ENCOUNTER — Observation Stay (HOSPITAL_BASED_OUTPATIENT_CLINIC_OR_DEPARTMENT_OTHER): Payer: Medicaid Other

## 2016-10-14 DIAGNOSIS — F1910 Other psychoactive substance abuse, uncomplicated: Secondary | ICD-10-CM | POA: Diagnosis not present

## 2016-10-14 DIAGNOSIS — R079 Chest pain, unspecified: Secondary | ICD-10-CM | POA: Diagnosis present

## 2016-10-14 DIAGNOSIS — I499 Cardiac arrhythmia, unspecified: Secondary | ICD-10-CM | POA: Diagnosis not present

## 2016-10-14 DIAGNOSIS — F17200 Nicotine dependence, unspecified, uncomplicated: Secondary | ICD-10-CM | POA: Diagnosis not present

## 2016-10-14 LAB — NM MYOCAR MULTI W/SPECT W/WALL MOTION / EF
CSEPED: 10 min
CSEPEDS: 30 s
CSEPHR: 90 %
Estimated workload: 12.5 METS
LV sys vol: 58 mL
LVDIAVOL: 99 mL (ref 62–150)
Peak HR: 164 {beats}/min
Rest HR: 71 {beats}/min
SDS: 0
SRS: 2
SSS: 1
TID: 0.83

## 2016-10-14 LAB — ECHOCARDIOGRAM COMPLETE
Height: 68 in
Weight: 2033.6 oz

## 2016-10-14 LAB — TROPONIN I

## 2016-10-14 LAB — LIPID PANEL
Cholesterol: 137 mg/dL (ref 0–200)
HDL: 47 mg/dL (ref >40)
LDL CALC: 72 mg/dL (ref 0–99)
TRIGLYCERIDES: 91 mg/dL (ref <150)
Total CHOL/HDL Ratio: 2.9 RATIO
VLDL: 18 mg/dL (ref 0–40)

## 2016-10-14 LAB — MAGNESIUM: Magnesium: 2 mg/dL (ref 1.7–2.4)

## 2016-10-14 MED ORDER — TECHNETIUM TC 99M TETROFOSMIN IV KIT
30.0000 | PACK | Freq: Once | INTRAVENOUS | Status: AC | PRN
  Administered 2016-10-14: 29.219 via INTRAVENOUS

## 2016-10-14 MED ORDER — TECHNETIUM TC 99M TETROFOSMIN IV KIT
12.3400 | PACK | Freq: Once | INTRAVENOUS | Status: AC | PRN
  Administered 2016-10-14: 12.34 via INTRAVENOUS

## 2016-10-14 NOTE — Progress Notes (Signed)
Patient discharged per MD order and hospital protocol. Patient verbalized understanding of discharge instructions and follow up appointments. Patients medications were not changed. Patient escorted off the unit with staff in wheelchair.

## 2016-10-14 NOTE — Progress Notes (Signed)
*  PRELIMINARY RESULTS* Echocardiogram 2D Echocardiogram has been performed.  Cristela BlueHege, Vincient Vanaman 10/14/2016, 9:51 AM

## 2016-10-14 NOTE — Consult Note (Signed)
Cardiology Consultation Note  Patient ID: Randy Abbott, MRN: [id number], DOB/AGE: 39-31-1977 39 y.o. Admit date: 10/13/2016   Date of Consult: 10/14/2016 Primary Physician: [facility] Primary Cardiologist: New to [facility] Requesting Physician: Dr. Imogene Burn, MD  Chief Complaint: Chest pain Reason for Consult: Unstable angina  HPI: 40 y.o. male with h/o significant family history of premature CAD with his maternal grandmother having an MI in her 42's, paternal grandfather with an MI at 39, and both parents with previous MI (age unknown), panreatitis, polysubstance abuse with prior cocaine abuse in his teens and early 20's, current marijuana abuse, tobacco abuse since age 59 at 2 packs daily, and alcohol abuse, and asthma who presented to [facility] on 12/28 with a several week history of chest pain that has been worsening over the past 3 days along the left side.   No previously known cardiac history. Patient has been noting left sided chest pain for the past several weeks. Pain is not associated with rest or exertion. Pain is pressure-like initially, though recently has been sharp at times leading to significant SOB. Pain will last from 1 minute to 4-5 minutes before improving. Pain is rated 8/10. On 12/28 his pain radiated to the left side of his neck and left arm and persisted leading him to come to the ED. He did take one of his father's SL NTG without resolution of pain. Nothing exacerbated the pain. He did note palpitations on 12/28. No associated nausea, vomiting, diaphoresis, dizzness, presyncope, or syncope.   Upon his admission he was found to have troponin negative x 2, SCr 0.72, K+ 4.1, wbc 9.5, hgb 13.4, plt 253, urine drug screen positive for marijuana, ethanol < 5, tsh normal. Heart rate has ranged from 47 bpm to 103 bpm with what appears to be runs of atrial tachycardia followed by post termination pauses and sinus bradycardia. Patient has been scheduled for nuclear stress test by  IM.   Past Medical History:  Diagnosis Date  . Alcohol abuse   . Asthma   . Cocaine abuse in remission   . Family history of premature CAD   . Kidney stones   . Marijuana abuse   . Pancreatitis   . Tobacco abuse       Most Recent Cardiac Studies: none   Surgical History:  Past Surgical History:  Procedure Laterality Date  . APPENDECTOMY       Home Meds: Prior to Admission medications   Medication Sig Start Date End Date Taking? Authorizing Provider  Multiple Vitamin (MULTIVITAMIN WITH MINERALS) TABS tablet Take 1 tablet by mouth daily. 09/30/15  Yes Enedina Finner, MD  Omeprazole 20 MG TBEC Take 20 mg by mouth daily. 07/06/12  Yes Historical Provider, MD  folic acid (FOLVITE) 1 MG tablet Take 1 tablet (1 mg total) by mouth daily. Patient not taking: Reported on 10/13/2016 09/30/15   Enedina Finner, MD  LORazepam (ATIVAN) 1 MG tablet Take 1 tablet (1 mg total) by mouth 2 (two) times daily as needed for anxiety (CIWA-AR > 8-OR-withdrawal symptoms:anxiety, agitation, insomnia, diaphoresis, nausea, vomiting, tremors, tachycardia, or hypertension.). Patient not taking: Reported on 10/13/2016 09/30/15   Enedina Finner, MD  ondansetron (ZOFRAN ODT) 4 MG disintegrating tablet Take 1 tablet (4 mg total) by mouth every 6 (six) hours as needed for nausea or vomiting. 10/24/15   Sharyn Creamer, MD  oxyCODONE-acetaminophen (ROXICET) 5-325 MG tablet Take 1 tablet by mouth every 4 (four) hours as needed for severe pain. Patient not taking: Reported  on 10/13/2016 09/28/15   Irean HongJade J Sung, MD  promethazine (PHENERGAN) 25 MG tablet Take 1 tablet (25 mg total) by mouth every 6 (six) hours as needed for nausea or vomiting. Patient not taking: Reported on 10/13/2016 10/01/15   Emily FilbertJonathan E Williams, MD  traMADol (ULTRAM) 50 MG tablet Take 1 tablet (50 mg total) by mouth every 6 (six) hours as needed. Patient not taking: Reported on 10/13/2016 06/08/15   Chinita Pesterari B Triplett, FNP    Inpatient Medications:  . aspirin   81 mg Oral Daily  . enoxaparin (LOVENOX) injection  40 mg Subcutaneous Q24H  . multivitamin with minerals  1 tablet Oral Daily  . nicotine  21 mg Transdermal Daily  . pantoprazole  40 mg Oral Daily     Allergies:  Allergies  Allergen Reactions  . Penicillins Hives  . Toradol [Ketorolac Tromethamine] Hives  . Tetracyclines & Related Rash    Social History   Social History  . Marital status: Married    Spouse name: N/A  . Number of children: N/A  . Years of education: N/A   Occupational History  . Holiday representativeconstruction    Social History Main Topics  . Smoking status: Current Every Day Smoker    Packs/day: 2.00    Years: 21.00    Types: Cigarettes  . Smokeless tobacco: Never Used  . Alcohol use 0.0 oz/week  . Drug use:     Types: Marijuana     Comment: Marijuana/ prior cocaine abuse in teens and 20's  . Sexual activity: Not on file   Other Topics Concern  . Not on file   Social History Narrative  . No narrative on file     Family History  Problem Relation Age of Onset  . Lung cancer Sister   . Lung cancer Other   . Colon cancer Mother   . CAD Mother   . Leukemia Other   . Heart disease Maternal Grandmother     a) passed from MI in early 3950's  . CAD Father   . CAD Paternal Grandfather     a) passed from MI at 3933     Review of Systems: Review of Systems  Constitutional: Positive for malaise/fatigue. Negative for chills, diaphoresis, fever and weight loss.  HENT: Negative for congestion.   Eyes: Negative for discharge and redness.  Respiratory: Negative for cough, hemoptysis, sputum production, shortness of breath and wheezing.   Cardiovascular: Positive for chest pain and palpitations. Negative for orthopnea, claudication, leg swelling and PND.  Gastrointestinal: Negative for abdominal pain, blood in stool, heartburn, melena, nausea and vomiting.  Genitourinary: Negative for hematuria.  Musculoskeletal: Negative for falls and myalgias.  Skin: Negative for rash.   Neurological: Positive for weakness. Negative for dizziness, tingling, tremors, sensory change, speech change, focal weakness and loss of consciousness.  Endo/Heme/Allergies: Does not bruise/bleed easily.  Psychiatric/Behavioral: Positive for substance abuse. The patient is not nervous/anxious.   All other systems reviewed and are negative.   Labs:  Recent Labs  10/13/16 1340 10/13/16 1915  TROPONINI <0.03 <0.03   Lab Results  Component Value Date   WBC 9.5 10/13/2016   HGB 13.4 10/13/2016   HCT 38.5 (L) 10/13/2016   MCV 91.6 10/13/2016   PLT 253 10/13/2016     Recent Labs Lab 10/13/16 1340  NA 140  K 4.1  CL 109  CO2 27  BUN 11  CREATININE 0.72  CALCIUM 8.7*  GLUCOSE 99   Lab Results  Component Value Date  CHOL 137 10/14/2016   HDL 47 10/14/2016   LDLCALC 72 10/14/2016   TRIG 91 10/14/2016   No results found for: DDIMER  Radiology/Studies:  Dg Chest Portable 1 View  Result Date: 10/13/2016 IMPRESSION: No active disease. Electronically Signed   By: Amie Portlandavid  Ormond M.D.   On: 10/13/2016 14:49    EKG: Interpreted by me showed: NSR, 67 bpm, no acute st/tchanges Telemetry: Interpreted by me showed: currently in NSR, with heart rates in the upper 60's bpm, episodes of sinus tachycardia into the 120's, also with episodes of what appears to be atrial tachycardia followed by pauses with the greatest being 2.5 seconds.   Weights: Filed Weights   10/13/16 1337 10/13/16 1652  Weight: 140 lb (63.5 kg) 127 lb 1.6 oz (57.7 kg)     Physical Exam: Blood pressure 137/79, pulse (!) 55, temperature 98 F (36.7 C), temperature source Oral, resp. rate 18, height 5\' 8"  (1.727 m), weight 127 lb 1.6 oz (57.7 kg), SpO2 99 %. Body mass index is 19.33 kg/m. General: Well developed, well nourished, in no acute distress. Head: Normocephalic, atraumatic, sclera non-icteric, no xanthomas, nares are without discharge.  Neck: Negative for carotid bruits. JVD not elevated. Lungs:  Clear bilaterally to auscultation without wheezes, rales, or rhonchi. Breathing is unlabored. Heart: RRR with S1 S2. No murmurs, rubs, or gallops appreciated. Abdomen: Soft, non-tender, non-distended with normoactive bowel sounds. No hepatomegaly. No rebound/guarding. No obvious abdominal masses. Msk:  Strength and tone appear normal for age. Extremities: No clubbing or cyanosis. No edema. Distal pedal pulses are 2+ and equal bilaterally. Multiple tattoos noted.  Neuro: Alert and oriented X 3. No facial asymmetry. No focal deficit. Moves all extremities spontaneously. Psych:  Responds to questions appropriately with a normal affect.    Assessment and Plan:  Principal Problem:   Chest pain with moderate risk for cardiac etiology Active Problems:   Polysubstance abuse   Arrhythmia    1.  Chest pain with moderate risk of cardiac etiology: -Currently chest pain free -Troponin negative -EKG non-acute -Discussed cardiac cath vs stress test with MD -Plan for stress test at this time -Echo pending -ASA -LDL 72 -A1C pending for further risk stratification   2. Arrhythmia: -TSH and potassium ok -Check magnesium -Possibly related to #1 -Check echo  3. Polysubstance abuse: -Cessation advised    Signed, Eula ListenRyan Debra Calabretta, PA-C [facility] Pager: [phone] 10/14/2016, 7:39 AM

## 2016-10-14 NOTE — Discharge Instructions (Signed)
Chest Wall Pain °Introduction °Chest wall pain is pain in or around the bones and muscles of your chest. Sometimes, an injury causes this pain. Sometimes, the cause may not be known. This pain may take several weeks or longer to get better. °Follow these instructions at home: °Pay attention to any changes in your symptoms. Take these actions to help with your pain: °· Rest as told by your doctor. °· Avoid activities that cause pain. Try not to use your chest, belly (abdominal), or side muscles to lift heavy things. °· If directed, apply ice to the painful area: °¨ Put ice in a plastic bag. °¨ Place a towel between your skin and the bag. °¨ Leave the ice on for 20 minutes, 2-3 times per day. °· Take over-the-counter and prescription medicines only as told by your doctor. °· Do not use tobacco products, including cigarettes, chewing tobacco, and e-cigarettes. If you need help quitting, ask your doctor. °· Keep all follow-up visits as told by your doctor. This is important. °Contact a doctor if: °· You have a fever. °· Your chest pain gets worse. °· You have new symptoms. °Get help right away if: °· You feel sick to your stomach (nauseous) or you throw up (vomit). °· You feel sweaty or light-headed. °· You have a cough with phlegm (sputum) or you cough up blood. °· You are short of breath. °This information is not intended to replace advice given to you by your health care provider. Make sure you discuss any questions you have with your health care provider. °Document Released: 03/21/2008 Document Revised: 03/10/2016 Document Reviewed: 12/29/2014 °© 2017 Elsevier ° °

## 2016-10-15 LAB — HEMOGLOBIN A1C
HEMOGLOBIN A1C: 5.4 % (ref 4.8–5.6)
Mean Plasma Glucose: 108 mg/dL

## 2016-10-15 NOTE — Discharge Summary (Signed)
[facility] [place] [facility]   PATIENT NAME: Randy LeydenRoy Desmarais    MR#:  [id number]  DATE OF BIRTH:  09/21/1976  DATE OF ADMISSION:  10/13/2016   ADMITTING PHYSICIAN: Shaune PollackQing Chen, MD  DATE OF DISCHARGE: 10/14/2016  2:34 PM  PRIMARY CARE PHYSICIAN: [facility]   ADMISSION DIAGNOSIS:  Bradycardia [R00.1] Tachycardia [R00.0] DISCHARGE DIAGNOSIS:  Principal Problem:   Chest pain Active Problems:   Polysubstance abuse   Arrhythmia   Smoker  SECONDARY DIAGNOSIS:   Past Medical History:  Diagnosis Date  . Alcohol abuse   . Asthma   . Cocaine abuse in remission   . Family history of premature CAD   . Kidney stones   . Marijuana abuse   . Pancreatitis   . Tobacco abuse    HOSPITAL COURSE:  40 y.o. male with a known history of Pancreatitis, drug abuse, alcohol abuse and asthma admitted for chest pain rule out.  * Chest pain: ruled out with neg serial troponins, neg myoview  History of drug abuse. Drug cessation was counseled. urine drug screen + for marijuana  Tobacco abuse. Smoking cessation was counseled for 3 minutes, nicotine patch.  DISCHARGE CONDITIONS:  STABLE CONSULTS OBTAINED:  Treatment Team:  Antonieta Ibaimothy J Gollan, MD DRUG ALLERGIES:   Allergies  Allergen Reactions  . Penicillins Hives  . Toradol [Ketorolac Tromethamine] Hives  . Tetracyclines & Related Rash   DISCHARGE MEDICATIONS:   Allergies as of 10/14/2016      Reactions   Penicillins Hives   Toradol [ketorolac Tromethamine] Hives   Tetracyclines & Related Rash      Medication List    TAKE these medications   folic acid 1 MG tablet Commonly known as:  FOLVITE Take 1 tablet (1 mg total) by mouth daily.   LORazepam 1 MG tablet Commonly known as:  ATIVAN Take 1 tablet (1 mg total) by mouth 2 (two) times daily as needed for anxiety (CIWA-AR > 8-OR-withdrawal symptoms:anxiety, agitation, insomnia, diaphoresis, nausea, vomiting, tremors, tachycardia, or  hypertension.).   multivitamin with minerals Tabs tablet Take 1 tablet by mouth daily.   Omeprazole 20 MG Tbec Take 20 mg by mouth daily.   ondansetron 4 MG disintegrating tablet Commonly known as:  ZOFRAN ODT Take 1 tablet (4 mg total) by mouth every 6 (six) hours as needed for nausea or vomiting.   oxyCODONE-acetaminophen 5-325 MG tablet Commonly known as:  ROXICET Take 1 tablet by mouth every 4 (four) hours as needed for severe pain.   promethazine 25 MG tablet Commonly known as:  PHENERGAN Take 1 tablet (25 mg total) by mouth every 6 (six) hours as needed for nausea or vomiting.   traMADol 50 MG tablet Commonly known as:  ULTRAM Take 1 tablet (50 mg total) by mouth every 6 (six) hours as needed.        DISCHARGE INSTRUCTIONS:   DIET:  Regular diet DISCHARGE CONDITION:  Good ACTIVITY:  Activity as tolerated OXYGEN:  Home Oxygen: No.  Oxygen Delivery: room air DISCHARGE LOCATION:  home   If you experience worsening of your admission symptoms, develop shortness of breath, life threatening emergency, suicidal or homicidal thoughts you must seek medical attention immediately by calling 911 or calling your MD immediately  if symptoms less severe.  You Must read complete instructions/literature along with all the possible adverse reactions/side effects for all the Medicines you take and that have been prescribed to you. Take any new Medicines after you have completely understood and  accpet all the possible adverse reactions/side effects.   Please note  You were cared for by a hospitalist during your hospital stay. If you have any questions about your discharge medications or the care you received while you were in the hospital after you are discharged, you can call the unit and asked to speak with the hospitalist on call if the hospitalist that took care of you is not available. Once you are discharged, your primary care physician will handle any further medical issues.  Please note that NO REFILLS for any discharge medications will be authorized once you are discharged, as it is imperative that you return to your primary care physician (or establish a relationship with a primary care physician if you do not have one) for your aftercare needs so that they can reassess your need for medications and monitor your lab values.    On the day of Discharge:  VITAL SIGNS:  Blood pressure 131/88, pulse (!) 57, temperature 98 F (36.7 C), temperature source Oral, resp. rate 14, height 5\' 8"  (1.727 m), weight 57.7 kg (127 lb 1.6 oz), SpO2 100 %. PHYSICAL EXAMINATION:  GENERAL:  40 y.o.-year-old patient lying in the bed with no acute distress.  EYES: Pupils equal, round, reactive to light and accommodation. No scleral icterus. Extraocular muscles intact.  HEENT: Head atraumatic, normocephalic. Oropharynx and nasopharynx clear.  NECK:  Supple, no jugular venous distention. No thyroid enlargement, no tenderness.  LUNGS: Normal breath sounds bilaterally, no wheezing, rales,rhonchi or crepitation. No use of accessory muscles of respiration.  CARDIOVASCULAR: S1, S2 normal. No murmurs, rubs, or gallops.  ABDOMEN: Soft, non-tender, non-distended. Bowel sounds present. No organomegaly or mass.  EXTREMITIES: No pedal edema, cyanosis, or clubbing.  NEUROLOGIC: Cranial nerves II through XII are intact. Muscle strength 5/5 in all extremities. Sensation intact. Gait not checked.  PSYCHIATRIC: The patient is alert and oriented x 3.  SKIN: No obvious rash, lesion, or ulcer.  DATA REVIEW:   CBC  Recent Labs Lab 10/13/16 1340  WBC 9.5  HGB 13.4  HCT 38.5*  PLT 253    Chemistries   Recent Labs Lab 10/13/16 1340 10/14/16 0755  NA 140  --   K 4.1  --   CL 109  --   CO2 27  --   GLUCOSE 99  --   BUN 11  --   CREATININE 0.72  --   CALCIUM 8.7*  --   MG  --  2.0     Follow-up Information    [facility]. Go on 10/18/2016.   Specialty:  General Practice Why:   at 2:20pm Contact information: [address] [place] [address] [phone]        Julien Nordmannimothy Gollan, MD. Go on 10/31/2016.   Specialty:  Cardiology Why:  at 2:30 with Eula Listenyan Dunn PA Contact information: [address] [place] [address] [phone]           RADIOLOGY:  Nm Myocar Multi W/spect W/wall Motion / Ef  Result Date: 10/14/2016 Exercise myocardial perfusion imaging study with no significant  ischemia Normal wall motion, EF estimated at 53% No EKG changes concerning for ischemia at peak stress or in recovery. Good exercise tolerance, target heart rate achieved Low risk scan Signed, Dossie Arbourim Gollan, MD, Ph.D [facility]     Management plans discussed with the patient, family and they are in agreement.  CODE STATUS: FULL CODE  TOTAL TIME TAKING CARE OF THIS PATIENT: 45 minutes.  Delfino Lovett M.D on 10/15/2016 at 10:49 AM  Between 7am to 6pm - Pager - [phone]  After 6pm go to [email or web address] - Social research officer, government  [facility]  Office  [phone]  CC: Primary care physician; [facility]   Note: This dictation was prepared with Dragon dictation along with smaller phrase technology. Any transcriptional errors that result from this process are unintentional.

## 2016-10-31 ENCOUNTER — Encounter: Payer: Medicaid Other | Admitting: Physician Assistant

## 2016-11-08 ENCOUNTER — Encounter: Payer: Medicaid Other | Admitting: Physician Assistant

## 2016-11-08 ENCOUNTER — Encounter: Payer: Self-pay | Admitting: *Deleted

## 2016-11-08 NOTE — Progress Notes (Deleted)
Cardiology Office Note Date:  11/08/2016  Patient ID:  Randy Abbott, Randy Abbott 08-09-1976, MRN [id number] PCP:  [facility]  Cardiologist:  Dr. Mariah Milling, MD  ***refresh   Chief Complaint: Hospital follow up  History of Present Illness: Randy Abbott is a 41 y.o. male with history of significant family history of premature CAD with his maternal grandmother having an MI in her 2's, paternal grandfather with an MI at 79, and both parents with previous MI (age unknown), pancreatitis, polysubstance abuse with prior cocaine abuse in his teens and early 20's, current marijuana abuse, tobacco abuse since age 50 at 2 packs daily, and alcohol abuse, and asthma who presents for hospital follow up after recent admission to [facility] from 12/28-12/29 for chest pain.   No previously known cardiac history. Patient admitted with left sided chest pain for the past several weeks prior to admission. Pain was not associated with rest or exertion. Pain would last 1-5 minutes with self resolution. Pain did not improve with his father's SL NTG. He ruled out. UDS positive for marijuana. He underwent nuclear stress test on 12/29 that showed no significant ischemia, normal wall motion, EF 53%, no EKG changes concerning for ischemia, good exercise tolerance, low risk scan. No significant arrhythmia was noted on telemetry.  ***   Past Medical History:  Diagnosis Date  . Alcohol abuse   . Asthma   . Cocaine abuse in remission   . Family history of premature CAD   . Kidney stones   . Marijuana abuse   . Pancreatitis   . Tobacco abuse     Past Surgical History:  Procedure Laterality Date  . APPENDECTOMY      Current Outpatient Prescriptions  Medication Sig Dispense Refill  . folic acid (FOLVITE) 1 MG tablet Take 1 tablet (1 mg total) by mouth daily. (Patient not taking: Reported on 10/13/2016) 30 tablet 0  . LORazepam (ATIVAN) 1 MG tablet Take 1 tablet (1 mg total) by mouth 2 (two) times daily as needed  for anxiety (CIWA-AR > 8-OR-withdrawal symptoms:anxiety, agitation, insomnia, diaphoresis, nausea, vomiting, tremors, tachycardia, or hypertension.). (Patient not taking: Reported on 10/13/2016) 20 tablet 0  . Multiple Vitamin (MULTIVITAMIN WITH MINERALS) TABS tablet Take 1 tablet by mouth daily. 30 tablet 0  . Omeprazole 20 MG TBEC Take 20 mg by mouth daily.    . ondansetron (ZOFRAN ODT) 4 MG disintegrating tablet Take 1 tablet (4 mg total) by mouth every 6 (six) hours as needed for nausea or vomiting. 20 tablet 0  . oxyCODONE-acetaminophen (ROXICET) 5-325 MG tablet Take 1 tablet by mouth every 4 (four) hours as needed for severe pain. (Patient not taking: Reported on 10/13/2016) 15 tablet 0  . promethazine (PHENERGAN) 25 MG tablet Take 1 tablet (25 mg total) by mouth every 6 (six) hours as needed for nausea or vomiting. (Patient not taking: Reported on 10/13/2016) 20 tablet 0  . traMADol (ULTRAM) 50 MG tablet Take 1 tablet (50 mg total) by mouth every 6 (six) hours as needed. (Patient not taking: Reported on 10/13/2016) 9 tablet 0   No current facility-administered medications for this visit.     Allergies:   Penicillins; Toradol [ketorolac tromethamine]; and Tetracyclines & related   Social History:  The patient  reports that he has been smoking Cigarettes.  He has a 42.00 pack-year smoking history. He has never used smokeless tobacco. He reports that he drinks alcohol. He reports that he uses drugs, including Marijuana.   Family  History:  The patient's family history includes CAD in his father, mother, and paternal grandfather; Colon cancer in his mother; Heart disease in his maternal grandmother; Leukemia in his other; Lung cancer in his other and sister.  ROS:   ROS   PHYSICAL EXAM: *** VS:  There were no vitals taken for this visit. BMI: There is no height or weight on file to calculate BMI.  Physical Exam   EKG:  Was ordered and interpreted by me today. Shows ***  Recent  Labs: 10/13/2016: BUN 11; Creatinine, Ser 0.72; Hemoglobin 13.4; Platelets 253; Potassium 4.1; Sodium 140; TSH 0.559 10/14/2016: Magnesium 2.0  10/14/2016: Cholesterol 137; HDL 47; LDL Cholesterol 72; Total CHOL/HDL Ratio 2.9; Triglycerides 91; VLDL 18   CrCl cannot be calculated (Patient's most recent lab result is older than the maximum 21 days allowed.).   Wt Readings from Last 3 Encounters:  10/13/16 127 lb 1.6 oz (57.7 kg)  10/23/15 145 lb (65.8 kg)  10/06/15 140 lb (63.5 kg)     Other studies reviewed: Additional studies/records reviewed today include: summarized above  ASSESSMENT AND PLAN:  1. ***  Disposition: F/u with *** in   Current medicines are reviewed at length with the patient today.  The patient did not have any concerns regarding medicines.  Elinor DodgeSigned, Rome Schlauch PA-C 11/08/2016 11:22 AM     [facility] [place] [address] [place], [place] [address] [phone]

## 2017-04-20 ENCOUNTER — Emergency Department
Admission: EM | Admit: 2017-04-20 | Discharge: 2017-04-21 | Disposition: A | Discharge status: Home / Self Care | Payer: Medicaid Other | Attending: Emergency Medicine | Admitting: Emergency Medicine

## 2017-04-20 DIAGNOSIS — Y999 Unspecified external cause status: Secondary | ICD-10-CM | POA: Insufficient documentation

## 2017-04-20 DIAGNOSIS — F17210 Nicotine dependence, cigarettes, uncomplicated: Secondary | ICD-10-CM | POA: Insufficient documentation

## 2017-04-20 DIAGNOSIS — Y92410 Unspecified street and highway as the place of occurrence of the external cause: Secondary | ICD-10-CM | POA: Insufficient documentation

## 2017-04-20 DIAGNOSIS — J45909 Unspecified asthma, uncomplicated: Secondary | ICD-10-CM | POA: Insufficient documentation

## 2017-04-20 DIAGNOSIS — Y939 Activity, unspecified: Secondary | ICD-10-CM | POA: Diagnosis not present

## 2017-04-20 DIAGNOSIS — S161XXA Strain of muscle, fascia and tendon at neck level, initial encounter: Secondary | ICD-10-CM | POA: Insufficient documentation

## 2017-04-20 DIAGNOSIS — S199XXA Unspecified injury of neck, initial encounter: Secondary | ICD-10-CM | POA: Diagnosis present

## 2017-04-20 DIAGNOSIS — S0990XA Unspecified injury of head, initial encounter: Secondary | ICD-10-CM | POA: Insufficient documentation

## 2017-04-20 DIAGNOSIS — Z79899 Other long term (current) drug therapy: Secondary | ICD-10-CM | POA: Diagnosis not present
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
## 2017-04-21 ENCOUNTER — Emergency Department: Payer: Medicaid Other

## 2017-04-21 LAB — CBC WITH DIFFERENTIAL/PLATELET
BASOS ABS: 0.1 10*3/uL (ref 0–0.1)
Basophils Relative: 1 %
EOS ABS: 0.4 10*3/uL (ref 0–0.7)
EOS PCT: 5 %
HCT: 36.6 % — ABNORMAL LOW (ref 40.0–52.0)
Hemoglobin: 12.8 g/dL — ABNORMAL LOW (ref 13.0–18.0)
LYMPHS ABS: 2.7 10*3/uL (ref 1.0–3.6)
Lymphocytes Relative: 35 %
MCH: 32.1 pg (ref 26.0–34.0)
MCHC: 35 g/dL (ref 32.0–36.0)
MCV: 91.6 fL (ref 80.0–100.0)
MONO ABS: 0.6 10*3/uL (ref 0.2–1.0)
Monocytes Relative: 8 %
Neutro Abs: 3.9 10*3/uL (ref 1.4–6.5)
Neutrophils Relative %: 51 %
Platelets: 217 10*3/uL (ref 150–440)
RBC: 3.99 MIL/uL — AB (ref 4.40–5.90)
RDW: 13.6 % (ref 11.5–14.5)
WBC: 7.6 10*3/uL (ref 3.8–10.6)

## 2017-04-21 LAB — COMPREHENSIVE METABOLIC PANEL
ALT: 19 U/L (ref 17–63)
AST: 20 U/L (ref 15–41)
Albumin: 3.9 g/dL (ref 3.5–5.0)
Alkaline Phosphatase: 55 U/L (ref 38–126)
Anion gap: 5 (ref 5–15)
BUN: 13 mg/dL (ref 6–20)
CHLORIDE: 105 mmol/L (ref 101–111)
CO2: 29 mmol/L (ref 22–32)
CREATININE: 0.73 mg/dL (ref 0.61–1.24)
Calcium: 9 mg/dL (ref 8.9–10.3)
Glucose, Bld: 95 mg/dL (ref 65–99)
POTASSIUM: 3.7 mmol/L (ref 3.5–5.1)
Sodium: 139 mmol/L (ref 135–145)
Total Bilirubin: 0.9 mg/dL (ref 0.3–1.2)
Total Protein: 6.3 g/dL — ABNORMAL LOW (ref 6.5–8.1)

## 2017-04-21 LAB — ETHANOL

## 2017-04-21 MED ORDER — OXYCODONE-ACETAMINOPHEN 5-325 MG PO TABS
1.0000 | ORAL_TABLET | Freq: Once | ORAL | Status: DC
Start: 2017-04-21 — End: 2017-04-21
  Filled 2017-04-21 (×2): qty 1

## 2017-04-21 MED ORDER — CYCLOBENZAPRINE HCL 10 MG PO TABS
5.0000 mg | ORAL_TABLET | Freq: Once | ORAL | Status: DC
  Filled 2017-04-21 (×2): qty 1

## 2017-04-21 MED ORDER — SODIUM CHLORIDE 0.9 % IV BOLUS (SEPSIS)
1000.0000 mL | Freq: Once | INTRAVENOUS | Status: AC
  Administered 2017-04-21: 1000 mL via INTRAVENOUS

## 2017-04-21 NOTE — ED Notes (Addendum)
While assessing pt's VS, pt was not able to keep eyes open and fell asleep. Pt's respirations were assessed and are 8 breaths per minute. When asking pt why is he so tired pt states "I'm just tired." Pt fell asleep again, unlabored resp noted. EDP made aware, hold medications at this time.
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
## 2017-04-21 NOTE — ED Notes (Signed)
Family at bedside. 

## 2017-04-21 NOTE — ED Notes (Signed)
Pt made aware urine sample is needed, urinal placed at pt's bedside.

## 2017-04-21 NOTE — ED Notes (Signed)
Patient transported to CT 

## 2017-04-21 NOTE — ED Notes (Signed)
Cardiac monitor attached to pt.

## 2017-04-21 NOTE — Discharge Instructions (Signed)
1. You may take Tylenol as needed for pain. 2. Apply moist heat to affected area several times daily. 3. Return to the ER for worsened symptoms, persistent vomiting, difficulty breathing or other concerns.

## 2017-09-16 DEATH — deceased
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
